# Patient Record
Sex: Male | Born: 1940 | Race: White | Hispanic: No | Smoking: Never smoker
Health system: Southern US, Community
[De-identification: ages and names within clinical notes are randomized; demographics above are authoritative.]

## PROBLEM LIST (undated history)

## (undated) DIAGNOSIS — I4891 Unspecified atrial fibrillation: Secondary | ICD-10-CM

## (undated) DIAGNOSIS — Z9289 Personal history of other medical treatment: Secondary | ICD-10-CM

## (undated) DIAGNOSIS — I739 Peripheral vascular disease, unspecified: Secondary | ICD-10-CM

## (undated) DIAGNOSIS — N529 Male erectile dysfunction, unspecified: Secondary | ICD-10-CM

## (undated) DIAGNOSIS — Z87442 Personal history of urinary calculi: Secondary | ICD-10-CM

## (undated) DIAGNOSIS — M1990 Unspecified osteoarthritis, unspecified site: Secondary | ICD-10-CM

## (undated) DIAGNOSIS — S82209A Unspecified fracture of shaft of unspecified tibia, initial encounter for closed fracture: Secondary | ICD-10-CM

## (undated) DIAGNOSIS — S72353A Displaced comminuted fracture of shaft of unspecified femur, initial encounter for closed fracture: Secondary | ICD-10-CM

## (undated) DIAGNOSIS — I639 Cerebral infarction, unspecified: Secondary | ICD-10-CM

## (undated) DIAGNOSIS — L409 Psoriasis, unspecified: Secondary | ICD-10-CM

## (undated) DIAGNOSIS — C61 Malignant neoplasm of prostate: Secondary | ICD-10-CM

## (undated) DIAGNOSIS — Z86711 Personal history of pulmonary embolism: Secondary | ICD-10-CM

## (undated) DIAGNOSIS — S82409A Unspecified fracture of shaft of unspecified fibula, initial encounter for closed fracture: Secondary | ICD-10-CM

## (undated) DIAGNOSIS — K4090 Unilateral inguinal hernia, without obstruction or gangrene, not specified as recurrent: Secondary | ICD-10-CM

## (undated) DIAGNOSIS — Z8781 Personal history of (healed) traumatic fracture: Secondary | ICD-10-CM

## (undated) DIAGNOSIS — E785 Hyperlipidemia, unspecified: Secondary | ICD-10-CM

## (undated) DIAGNOSIS — K8020 Calculus of gallbladder without cholecystitis without obstruction: Secondary | ICD-10-CM

## (undated) HISTORY — DX: Unspecified osteoarthritis, unspecified site: M19.90

## (undated) HISTORY — DX: Hyperlipidemia, unspecified: E78.5

## (undated) HISTORY — PX: TONSILLECTOMY: SUR1361

## (undated) HISTORY — DX: Unspecified atrial fibrillation: I48.91

## (undated) HISTORY — PX: EXTRACORPOREAL SHOCK WAVE LITHOTRIPSY: SHX1557

## (undated) HISTORY — PX: ORIF FEMORAL SHAFT FRACTURE W/ PLATES AND SCREWS: SUR931

## (undated) HISTORY — DX: Male erectile dysfunction, unspecified: N52.9

## (undated) HISTORY — DX: Malignant neoplasm of prostate: C61

## (undated) HISTORY — DX: Psoriasis, unspecified: L40.9

## (undated) HISTORY — PX: CATARACT EXTRACTION W/ INTRAOCULAR LENS IMPLANT: SHX1309

## (undated) HISTORY — DX: Personal history of (healed) traumatic fracture: Z87.81

## (undated) HISTORY — PX: OTHER SURGICAL HISTORY: SHX169

## (undated) HISTORY — DX: Unspecified fracture of shaft of unspecified tibia, initial encounter for closed fracture: S82.409A

## (undated) HISTORY — DX: Displaced comminuted fracture of shaft of unspecified femur, initial encounter for closed fracture: S72.353A

## (undated) HISTORY — DX: Unspecified fracture of shaft of unspecified tibia, initial encounter for closed fracture: S82.209A

---

## 1989-03-25 DIAGNOSIS — Z86711 Personal history of pulmonary embolism: Secondary | ICD-10-CM

## 1989-03-25 DIAGNOSIS — I639 Cerebral infarction, unspecified: Secondary | ICD-10-CM

## 1989-03-25 HISTORY — DX: Personal history of pulmonary embolism: Z86.711

## 1989-03-25 HISTORY — DX: Cerebral infarction, unspecified: I63.9

## 2004-12-05 ENCOUNTER — Ambulatory Visit: Payer: Self-pay | Admitting: Internal Medicine

## 2004-12-27 ENCOUNTER — Ambulatory Visit: Payer: Self-pay | Admitting: Internal Medicine

## 2006-04-04 ENCOUNTER — Ambulatory Visit: Payer: Self-pay | Admitting: Internal Medicine

## 2006-11-26 ENCOUNTER — Encounter: Payer: Self-pay | Admitting: Internal Medicine

## 2006-11-26 DIAGNOSIS — L408 Other psoriasis: Secondary | ICD-10-CM | POA: Insufficient documentation

## 2006-11-26 DIAGNOSIS — F528 Other sexual dysfunction not due to a substance or known physiological condition: Secondary | ICD-10-CM | POA: Insufficient documentation

## 2006-11-26 DIAGNOSIS — M19019 Primary osteoarthritis, unspecified shoulder: Secondary | ICD-10-CM | POA: Insufficient documentation

## 2006-11-26 DIAGNOSIS — E785 Hyperlipidemia, unspecified: Secondary | ICD-10-CM | POA: Insufficient documentation

## 2006-11-29 DIAGNOSIS — M159 Polyosteoarthritis, unspecified: Secondary | ICD-10-CM | POA: Insufficient documentation

## 2006-11-29 DIAGNOSIS — M1990 Unspecified osteoarthritis, unspecified site: Secondary | ICD-10-CM

## 2006-12-09 ENCOUNTER — Ambulatory Visit: Payer: Self-pay | Admitting: Internal Medicine

## 2006-12-10 LAB — CONVERTED CEMR LAB
Glucose, Bld: 87 mg/dL (ref 70–99)
PSA: 0.02 ng/mL — ABNORMAL LOW (ref 0.10–4.00)

## 2007-08-05 ENCOUNTER — Telehealth (INDEPENDENT_AMBULATORY_CARE_PROVIDER_SITE_OTHER): Payer: Self-pay | Admitting: *Deleted

## 2008-02-04 ENCOUNTER — Telehealth: Payer: Self-pay | Admitting: Internal Medicine

## 2008-02-09 ENCOUNTER — Ambulatory Visit: Payer: Self-pay | Admitting: Family Medicine

## 2008-02-09 DIAGNOSIS — M7120 Synovial cyst of popliteal space [Baker], unspecified knee: Secondary | ICD-10-CM | POA: Insufficient documentation

## 2008-04-14 ENCOUNTER — Ambulatory Visit: Payer: Self-pay | Admitting: Internal Medicine

## 2008-04-14 DIAGNOSIS — C61 Malignant neoplasm of prostate: Secondary | ICD-10-CM

## 2008-04-14 DIAGNOSIS — Z8546 Personal history of malignant neoplasm of prostate: Secondary | ICD-10-CM | POA: Insufficient documentation

## 2008-04-18 LAB — CONVERTED CEMR LAB
ALT: 14 units/L (ref 0–53)
AST: 23 units/L (ref 0–37)
Albumin: 3.6 g/dL (ref 3.5–5.2)
Basophils Relative: 0.5 % (ref 0.0–3.0)
Calcium: 9.1 mg/dL (ref 8.4–10.5)
Eosinophils Relative: 1.7 % (ref 0.0–5.0)
GFR calc Af Amer: 125 mL/min
GFR calc non Af Amer: 103 mL/min
Glucose, Bld: 74 mg/dL (ref 70–99)
HCT: 36.1 % — ABNORMAL LOW (ref 39.0–52.0)
Hemoglobin: 12.5 g/dL — ABNORMAL LOW (ref 13.0–17.0)
Lymphocytes Relative: 30.8 % (ref 12.0–46.0)
Monocytes Absolute: 0.4 10*3/uL (ref 0.1–1.0)
Monocytes Relative: 5.4 % (ref 3.0–12.0)
Neutro Abs: 4.1 10*3/uL (ref 1.4–7.7)
PSA: 0.38 ng/mL (ref 0.10–4.00)
Phosphorus: 3.8 mg/dL (ref 2.3–4.6)
Potassium: 4.1 meq/L (ref 3.5–5.1)
RBC: 3.78 M/uL — ABNORMAL LOW (ref 4.22–5.81)
Sodium: 138 meq/L (ref 135–145)
Total Protein: 6.9 g/dL (ref 6.0–8.3)

## 2008-04-27 ENCOUNTER — Ambulatory Visit: Payer: Self-pay | Admitting: Internal Medicine

## 2008-05-06 ENCOUNTER — Telehealth: Payer: Self-pay | Admitting: Internal Medicine

## 2008-05-11 ENCOUNTER — Ambulatory Visit: Payer: Self-pay | Admitting: Internal Medicine

## 2008-05-11 LAB — HM COLONOSCOPY

## 2009-03-25 HISTORY — PX: HERNIA REPAIR: SHX51

## 2009-05-11 ENCOUNTER — Ambulatory Visit: Payer: Self-pay | Admitting: Internal Medicine

## 2009-05-11 DIAGNOSIS — H539 Unspecified visual disturbance: Secondary | ICD-10-CM | POA: Insufficient documentation

## 2009-05-19 ENCOUNTER — Encounter: Payer: Self-pay | Admitting: Internal Medicine

## 2009-05-25 ENCOUNTER — Encounter: Payer: Self-pay | Admitting: Internal Medicine

## 2009-05-25 ENCOUNTER — Ambulatory Visit: Payer: Self-pay

## 2009-08-28 ENCOUNTER — Ambulatory Visit: Payer: Self-pay | Admitting: Internal Medicine

## 2009-08-28 DIAGNOSIS — R011 Cardiac murmur, unspecified: Secondary | ICD-10-CM | POA: Insufficient documentation

## 2009-08-30 LAB — CONVERTED CEMR LAB
Albumin: 3.8 g/dL (ref 3.5–5.2)
Alkaline Phosphatase: 73 units/L (ref 39–117)
BUN: 19 mg/dL (ref 6–23)
Basophils Absolute: 0.1 10*3/uL (ref 0.0–0.1)
Chloride: 106 meq/L (ref 96–112)
Creatinine, Ser: 0.9 mg/dL (ref 0.4–1.5)
Eosinophils Absolute: 0.1 10*3/uL (ref 0.0–0.7)
Hemoglobin: 12.8 g/dL — ABNORMAL LOW (ref 13.0–17.0)
Lymphocytes Relative: 30.6 % (ref 12.0–46.0)
Lymphs Abs: 1.8 10*3/uL (ref 0.7–4.0)
MCHC: 35 g/dL (ref 30.0–36.0)
Neutro Abs: 3.3 10*3/uL (ref 1.4–7.7)
PSA: 2.5 ng/mL (ref 0.10–4.00)
Phosphorus: 3.8 mg/dL (ref 2.3–4.6)
Platelets: 288 10*3/uL (ref 150.0–400.0)
RDW: 13.8 % (ref 11.5–14.6)
TSH: 2.72 microintl units/mL (ref 0.35–5.50)
Total Bilirubin: 0.6 mg/dL (ref 0.3–1.2)

## 2009-12-04 ENCOUNTER — Ambulatory Visit: Payer: Self-pay | Admitting: Internal Medicine

## 2009-12-04 DIAGNOSIS — K625 Hemorrhage of anus and rectum: Secondary | ICD-10-CM | POA: Insufficient documentation

## 2009-12-29 ENCOUNTER — Ambulatory Visit: Payer: Self-pay | Admitting: Internal Medicine

## 2009-12-29 DIAGNOSIS — K439 Ventral hernia without obstruction or gangrene: Secondary | ICD-10-CM | POA: Insufficient documentation

## 2010-01-09 ENCOUNTER — Encounter: Payer: Self-pay | Admitting: Internal Medicine

## 2010-01-11 ENCOUNTER — Ambulatory Visit: Payer: Self-pay | Admitting: General Surgery

## 2010-01-15 ENCOUNTER — Ambulatory Visit: Payer: Self-pay | Admitting: General Surgery

## 2010-01-23 ENCOUNTER — Encounter: Payer: Self-pay | Admitting: Internal Medicine

## 2010-01-23 ENCOUNTER — Ambulatory Visit: Payer: Self-pay | Admitting: General Surgery

## 2010-01-23 HISTORY — PX: OTHER SURGICAL HISTORY: SHX169

## 2010-02-02 ENCOUNTER — Encounter: Payer: Self-pay | Admitting: Internal Medicine

## 2010-03-02 ENCOUNTER — Encounter: Payer: Self-pay | Admitting: Internal Medicine
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
## 2010-04-25 NOTE — Letter (Signed)
Summary: [facility]  [facility]   Imported By: Maryln Gottron 02/16/2010 10:33:53  _____________________________________________________________________  External Attachment:    Type:   Image     Comment:   External Document  Appended Document:  [facility] doing well after laparoscopic ventral hernia repair

## 2010-04-25 NOTE — Letter (Signed)
Summary: [facility]  [facility]   Imported By: Lanelle Bal 01/22/2010 11:27:57  _____________________________________________________________________  External Attachment:    Type:   Image     Comment:   External Document  Appended Document: [facility] Planning CT then laparoscopic repair of ventral hernias
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
## 2010-04-25 NOTE — Miscellaneous (Signed)
Summary: Orders Update  Clinical Lists Changes  Orders: Added new Test order of Carotid Duplex (Carotid Duplex) - Signed 
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
## 2010-04-26 NOTE — Letter (Signed)
Summary: [facility]  [facility]   Imported By: Lanelle Bal 03/14/2010 09:43:54  _____________________________________________________________________  External Attachment:    Type:   Image     Comment:   External Document  Appended Document: [facility] some strain but healing after hernia repair

## 2010-05-15 ENCOUNTER — Encounter: Payer: Self-pay | Admitting: Internal Medicine

## 2010-05-31 NOTE — Letter (Signed)
Summary: [facility]  [facility]   Imported By: Kassie Mends 05/23/2010 09:03:23  _____________________________________________________________________  External Attachment:    Type:   Image     Comment:   External Document  Appended Document: [facility] recovered from ventral hernia repair

## 2010-08-06 ENCOUNTER — Other Ambulatory Visit: Payer: Self-pay | Admitting: Internal Medicine

## 2010-08-07 NOTE — Telephone Encounter (Signed)
Okay to send Rx for 1 year Have him schedule a physical for the fall

## 2010-08-07 NOTE — Telephone Encounter (Signed)
rx sent to pharmacy

## 2011-08-30 ENCOUNTER — Other Ambulatory Visit: Payer: Self-pay | Admitting: Internal Medicine

## 2011-09-03 ENCOUNTER — Encounter: Payer: Self-pay | Admitting: Internal Medicine

## 2011-09-04 ENCOUNTER — Encounter: Payer: Self-pay | Admitting: Internal Medicine

## 2011-09-04 ENCOUNTER — Ambulatory Visit (INDEPENDENT_AMBULATORY_CARE_PROVIDER_SITE_OTHER): Payer: BC Managed Care – HMO | Admitting: Internal Medicine

## 2011-09-04 VITALS — BP 130/70 | HR 75 | Temp 97.7°F | Ht 70.0 in | Wt 206.0 lb

## 2011-09-04 DIAGNOSIS — IMO0002 Reserved for concepts with insufficient information to code with codable children: Secondary | ICD-10-CM

## 2011-09-04 DIAGNOSIS — K625 Hemorrhage of anus and rectum: Secondary | ICD-10-CM

## 2011-09-04 DIAGNOSIS — M1710 Unilateral primary osteoarthritis, unspecified knee: Secondary | ICD-10-CM

## 2011-09-04 MED ORDER — TADALAFIL 5 MG PO TABS: 5.0000 mg | ORAL_TABLET | Freq: Every day | ORAL | Status: DC

## 2011-09-04 MED ORDER — HYDROCORTISONE ACETATE 25 MG RE SUPP: 25.0000 mg | Freq: Two times a day (BID) | RECTAL | Status: DC | PRN

## 2011-09-04 NOTE — Patient Instructions (Signed)
Please try the suppositories and use Tuck's pads (witch hazel) for wiping  Trying acetaminophen arthritis formula 650mg  three times daily Use ibuprofen 400mg  up to three times daily as well We will reevaluate your joint pain at your follow up appt

## 2011-09-04 NOTE — Assessment & Plan Note (Signed)
Will try anusol suppositories Has post op changes that cause some pocketing Try Tucks wipes also
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
## 2011-11-21 ENCOUNTER — Ambulatory Visit (INDEPENDENT_AMBULATORY_CARE_PROVIDER_SITE_OTHER): Payer: BC Managed Care – HMO | Admitting: Internal Medicine

## 2011-11-21 ENCOUNTER — Encounter: Payer: Self-pay | Admitting: Internal Medicine

## 2011-11-21 VITALS — BP 140/80 | HR 72 | Temp 97.9°F | Ht 70.0 in | Wt 203.0 lb

## 2011-11-21 DIAGNOSIS — IMO0002 Reserved for concepts with insufficient information to code with codable children: Secondary | ICD-10-CM

## 2011-11-21 DIAGNOSIS — C61 Malignant neoplasm of prostate: Secondary | ICD-10-CM

## 2011-11-21 DIAGNOSIS — Z0000 Encounter for general adult medical examination without abnormal findings: Secondary | ICD-10-CM

## 2011-11-21 DIAGNOSIS — M1710 Unilateral primary osteoarthritis, unspecified knee: Secondary | ICD-10-CM

## 2011-11-21 DIAGNOSIS — E785 Hyperlipidemia, unspecified: Secondary | ICD-10-CM
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
## 2011-11-21 NOTE — Assessment & Plan Note (Signed)
Not really interested in Rx Will recheck

## 2011-11-21 NOTE — Patient Instructions (Signed)
Please set up appointment with Dr Patsy Lager to evaluate chronic knee and hip pain

## 2011-11-21 NOTE — Assessment & Plan Note (Signed)
On his meds from [place] Will check PSA

## 2011-11-21 NOTE — Assessment & Plan Note (Signed)
Ongoing issues Will refer to Dr Patsy Lager to start

## 2011-11-21 NOTE — Assessment & Plan Note (Signed)
Fairly healthy Rx given for zostavax UTD on colon

## 2011-11-22 LAB — HEPATIC FUNCTION PANEL
ALT: 34 U/L (ref 0–53)
AST: 34 U/L (ref 0–37)
Albumin: 4.1 g/dL (ref 3.5–5.2)
Total Bilirubin: 0.6 mg/dL (ref 0.3–1.2)

## 2011-11-22 LAB — LIPID PANEL
Cholesterol: 244 mg/dL — ABNORMAL HIGH (ref 0–200)
HDL: 49.8 mg/dL
Total CHOL/HDL Ratio: 5
Triglycerides: 224 mg/dL — ABNORMAL HIGH (ref 0.0–149.0)
VLDL: 44.8 mg/dL — ABNORMAL HIGH (ref 0.0–40.0)

## 2011-11-22 LAB — BASIC METABOLIC PANEL
BUN: 25 mg/dL — ABNORMAL HIGH (ref 6–23)
CO2: 26 mEq/L (ref 19–32)
Chloride: 106 mEq/L (ref 96–112)
Creatinine, Ser: 1.1 mg/dL (ref 0.4–1.5)
Glucose, Bld: 87 mg/dL (ref 70–99)
Potassium: 4.1 mEq/L (ref 3.5–5.1)

## 2011-11-22 LAB — CBC WITH DIFFERENTIAL/PLATELET
Eosinophils Absolute: 0.1 10*3/uL (ref 0.0–0.7)
Eosinophils Relative: 1.7 % (ref 0.0–5.0)
HCT: 43.5 % (ref 39.0–52.0)
Lymphs Abs: 2.1 10*3/uL (ref 0.7–4.0)
MCHC: 33.3 g/dL (ref 30.0–36.0)
MCV: 100.8 fl — ABNORMAL HIGH (ref 78.0–100.0)
Monocytes Absolute: 0.6 10*3/uL (ref 0.1–1.0)
Neutrophils Relative %: 57.8 % (ref 43.0–77.0)
Platelets: 252 10*3/uL (ref 150.0–400.0)

## 2011-11-22 LAB — PSA: PSA: 5.19 ng/mL — ABNORMAL HIGH (ref 0.10–4.00)

## 2011-11-22 LAB — LDL CHOLESTEROL, DIRECT: Direct LDL: 173.9 mg/dL

## 2011-11-22 LAB — TSH: TSH: 2.17 u[IU]/mL (ref 0.35–5.50)

## 2011-11-28 ENCOUNTER — Ambulatory Visit (INDEPENDENT_AMBULATORY_CARE_PROVIDER_SITE_OTHER): Payer: BC Managed Care – HMO | Admitting: Family Medicine

## 2011-11-28 ENCOUNTER — Ambulatory Visit (INDEPENDENT_AMBULATORY_CARE_PROVIDER_SITE_OTHER)
Admission: RE | Admit: 2011-11-28 | Discharge: 2011-11-28 | Disposition: A | Discharge status: Home / Self Care | Payer: BC Managed Care – HMO | Source: Ambulatory Visit | Attending: Family Medicine | Admitting: Family Medicine

## 2011-11-28 ENCOUNTER — Encounter: Payer: Self-pay | Admitting: Family Medicine

## 2011-11-28 VITALS — BP 130/80 | HR 60 | Resp 16 | Wt 204.8 lb

## 2011-11-28 DIAGNOSIS — M25569 Pain in unspecified knee: Secondary | ICD-10-CM

## 2011-11-28 DIAGNOSIS — M170 Bilateral primary osteoarthritis of knee: Secondary | ICD-10-CM

## 2011-11-28 DIAGNOSIS — M25559 Pain in unspecified hip: Secondary | ICD-10-CM

## 2011-11-28 DIAGNOSIS — M1610 Unilateral primary osteoarthritis, unspecified hip: Secondary | ICD-10-CM

## 2011-11-28 DIAGNOSIS — M2170 Unequal limb length (acquired), unspecified site: Secondary | ICD-10-CM

## 2011-11-28 DIAGNOSIS — M1710 Unilateral primary osteoarthritis, unspecified knee: Secondary | ICD-10-CM

## 2011-11-28 MED ORDER — TRAMADOL HCL 50 MG PO TABS: 50.0000 mg | ORAL_TABLET | Freq: Four times a day (QID) | ORAL | Status: DC | PRN

## 2011-11-28 MED ORDER — DICLOFENAC SODIUM 75 MG PO TBEC: 75.0000 mg | DELAYED_RELEASE_TABLET | Freq: Two times a day (BID) | ORAL | Status: DC
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
## 2011-11-29 ENCOUNTER — Encounter: Payer: Self-pay | Admitting: *Deleted

## 2011-11-29 ENCOUNTER — Encounter: Payer: Self-pay | Admitting: Family Medicine

## 2011-11-29 DIAGNOSIS — M1610 Unilateral primary osteoarthritis, unspecified hip: Secondary | ICD-10-CM | POA: Insufficient documentation

## 2011-11-29 DIAGNOSIS — M170 Bilateral primary osteoarthritis of knee: Secondary | ICD-10-CM | POA: Insufficient documentation

## 2011-12-11 ENCOUNTER — Other Ambulatory Visit: Payer: Self-pay | Admitting: Internal Medicine

## 2012-02-17 ENCOUNTER — Other Ambulatory Visit: Payer: Self-pay | Admitting: Internal Medicine

## 2012-04-24 ENCOUNTER — Other Ambulatory Visit: Payer: Self-pay | Admitting: Family Medicine

## 2012-04-24 ENCOUNTER — Other Ambulatory Visit: Payer: Self-pay | Admitting: Internal Medicine

## 2012-04-24 NOTE — Telephone Encounter (Signed)
rx sent to pharmacy by e-script  

## 2012-04-24 NOTE — Telephone Encounter (Signed)
Okay #60 x 0 

## 2012-07-08 ENCOUNTER — Other Ambulatory Visit: Payer: Self-pay | Admitting: Internal Medicine

## 2012-07-08 NOTE — Telephone Encounter (Signed)
rx sent to pharmacy by e-script  

## 2012-07-08 NOTE — Telephone Encounter (Signed)
Okay #60 x 0 

## 2012-09-14 ENCOUNTER — Other Ambulatory Visit: Payer: Self-pay | Admitting: Internal Medicine

## 2012-09-14 ENCOUNTER — Other Ambulatory Visit: Payer: Self-pay | Admitting: Family Medicine

## 2012-09-15 NOTE — Telephone Encounter (Signed)
Okay to fill for a year 

## 2012-09-15 NOTE — Telephone Encounter (Signed)
rx sent to pharmacy by e-script  

## 2012-09-15 NOTE — Telephone Encounter (Signed)
Okay #60 x 0 

## 2012-10-12 ENCOUNTER — Other Ambulatory Visit: Payer: Self-pay | Admitting: Internal Medicine

## 2012-10-13 NOTE — Telephone Encounter (Signed)
rx sent to pharmacy by e-script  

## 2012-10-13 NOTE — Telephone Encounter (Signed)
Okay to refill for a year 

## 2012-11-19 ENCOUNTER — Ambulatory Visit (INDEPENDENT_AMBULATORY_CARE_PROVIDER_SITE_OTHER): Payer: Medicare Other | Admitting: Internal Medicine

## 2012-11-19 ENCOUNTER — Encounter: Payer: Self-pay | Admitting: Internal Medicine

## 2012-11-19 VITALS — BP 150/80 | HR 69 | Temp 98.0°F | Ht 70.0 in | Wt 192.0 lb

## 2012-11-19 DIAGNOSIS — M1710 Unilateral primary osteoarthritis, unspecified knee: Secondary | ICD-10-CM

## 2012-11-19 DIAGNOSIS — Z23 Encounter for immunization: Secondary | ICD-10-CM

## 2012-11-19 DIAGNOSIS — C61 Malignant neoplasm of prostate: Secondary | ICD-10-CM

## 2012-11-19 DIAGNOSIS — M170 Bilateral primary osteoarthritis of knee: Secondary | ICD-10-CM

## 2012-11-19 DIAGNOSIS — E785 Hyperlipidemia, unspecified: Secondary | ICD-10-CM

## 2012-11-19 DIAGNOSIS — Z0000 Encounter for general adult medical examination without abnormal findings: Secondary | ICD-10-CM

## 2012-11-19 MED ORDER — ZOSTER VACCINE LIVE 19400 UNT/0.65ML ~~LOC~~ SOLR: 0.6500 mL | Freq: Once | SUBCUTANEOUS | Status: DC

## 2012-11-19 NOTE — Assessment & Plan Note (Signed)
Generally healthy Rx for zostavax UTD otherwise

## 2012-11-19 NOTE — Assessment & Plan Note (Signed)
Doing well with tramadol and occ ibuprofen

## 2012-11-19 NOTE — Addendum Note (Signed)
Addended by: Sueanne Margarita on: 11/19/2012 05:28 PM   Modules accepted: Orders
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
## 2012-11-19 NOTE — Assessment & Plan Note (Signed)
Discussed primary prevention with statin---he doesn't want this

## 2012-11-19 NOTE — Assessment & Plan Note (Signed)
Back on his meds If not up much, no change If up some, he will increase dose If very high, we will discuss

## 2012-11-20 ENCOUNTER — Other Ambulatory Visit: Payer: Self-pay | Admitting: *Deleted

## 2012-11-20 LAB — HEPATIC FUNCTION PANEL
ALT: 19 U/L (ref 0–53)
AST: 20 U/L (ref 0–37)
Albumin: 3.8 g/dL (ref 3.5–5.2)
Alkaline Phosphatase: 60 U/L (ref 39–117)
Bilirubin, Direct: 0.1 mg/dL (ref 0.0–0.3)
Total Bilirubin: 0.5 mg/dL (ref 0.3–1.2)
Total Protein: 6.9 g/dL (ref 6.0–8.3)

## 2012-11-20 LAB — TSH: TSH: 2.99 u[IU]/mL (ref 0.35–5.50)

## 2012-11-20 LAB — CBC WITH DIFFERENTIAL/PLATELET
Basophils Absolute: 0 K/uL (ref 0.0–0.1)
Basophils Relative: 0.6 % (ref 0.0–3.0)
Eosinophils Absolute: 0.1 K/uL (ref 0.0–0.7)
Eosinophils Relative: 1.5 % (ref 0.0–5.0)
HCT: 38.1 % — ABNORMAL LOW (ref 39.0–52.0)
Hemoglobin: 13.3 g/dL (ref 13.0–17.0)
Lymphocytes Relative: 28.7 % (ref 12.0–46.0)
Lymphs Abs: 2 K/uL (ref 0.7–4.0)
MCHC: 34.9 g/dL (ref 30.0–36.0)
MCV: 98.1 fl (ref 78.0–100.0)
Monocytes Absolute: 0.5 K/uL (ref 0.1–1.0)
Monocytes Relative: 6.5 % (ref 3.0–12.0)
Neutro Abs: 4.3 K/uL (ref 1.4–7.7)
Neutrophils Relative %: 62.7 % (ref 43.0–77.0)
Platelets: 261 K/uL (ref 150.0–400.0)
RBC: 3.88 Mil/uL — ABNORMAL LOW (ref 4.22–5.81)
RDW: 13.2 % (ref 11.5–14.6)
WBC: 6.9 K/uL (ref 4.5–10.5)

## 2012-11-20 LAB — LDL CHOLESTEROL, DIRECT: Direct LDL: 186.1 mg/dL

## 2012-11-20 LAB — LIPID PANEL
Cholesterol: 272 mg/dL — ABNORMAL HIGH (ref 0–200)
HDL: 62 mg/dL (ref >39.00)
Total CHOL/HDL Ratio: 4
Triglycerides: 191 mg/dL — ABNORMAL HIGH (ref 0.0–149.0)
VLDL: 38.2 mg/dL (ref 0.0–40.0)

## 2012-11-20 LAB — BASIC METABOLIC PANEL
BUN: 21 mg/dL (ref 6–23)
CO2: 28 mEq/L (ref 19–32)
Calcium: 9.2 mg/dL (ref 8.4–10.5)
Glucose, Bld: 99 mg/dL (ref 70–99)
Potassium: 3.8 mEq/L (ref 3.5–5.1)
Sodium: 137 mEq/L (ref 135–145)

## 2012-11-20 MED ORDER — TRAMADOL HCL 50 MG PO TABS: ORAL_TABLET | ORAL | Status: DC

## 2012-11-20 NOTE — Telephone Encounter (Signed)
Last filled 09/15/12 

## 2012-11-20 NOTE — Telephone Encounter (Signed)
Phoned in to pharmacy. 

## 2012-11-20 NOTE — Telephone Encounter (Signed)
Okay #60 x 0 

## 2012-12-01 ENCOUNTER — Other Ambulatory Visit: Payer: Self-pay | Admitting: *Deleted

## 2012-12-02 NOTE — Telephone Encounter (Signed)
Just ordered 8/29 ----see what happened

## 2012-12-02 NOTE — Telephone Encounter (Signed)
duplicate

## 2013-10-25 ENCOUNTER — Other Ambulatory Visit: Payer: Self-pay | Admitting: Internal Medicine

## 2013-10-27 ENCOUNTER — Other Ambulatory Visit: Payer: Self-pay | Admitting: Internal Medicine

## 2013-11-05 ENCOUNTER — Encounter: Payer: Self-pay | Admitting: Internal Medicine

## 2013-12-06 ENCOUNTER — Other Ambulatory Visit: Payer: Self-pay | Admitting: Internal Medicine

## 2013-12-16 ENCOUNTER — Encounter: Payer: Self-pay | Admitting: Internal Medicine

## 2013-12-16 ENCOUNTER — Ambulatory Visit (INDEPENDENT_AMBULATORY_CARE_PROVIDER_SITE_OTHER): Payer: Medicare Other | Admitting: Internal Medicine

## 2013-12-16 VITALS — BP 127/60 | HR 67 | Temp 98.0°F | Wt 199.0 lb

## 2013-12-16 DIAGNOSIS — IMO0002 Reserved for concepts with insufficient information to code with codable children: Secondary | ICD-10-CM

## 2013-12-16 DIAGNOSIS — M2170 Unequal limb length (acquired), unspecified site: Secondary | ICD-10-CM

## 2013-12-16 DIAGNOSIS — Z23 Encounter for immunization: Secondary | ICD-10-CM

## 2013-12-16 DIAGNOSIS — M1710 Unilateral primary osteoarthritis, unspecified knee: Secondary | ICD-10-CM

## 2013-12-16 DIAGNOSIS — M170 Bilateral primary osteoarthritis of knee: Secondary | ICD-10-CM

## 2013-12-16 DIAGNOSIS — F528 Other sexual dysfunction not due to a substance or known physiological condition: Secondary | ICD-10-CM

## 2013-12-16 DIAGNOSIS — C61 Malignant neoplasm of prostate: Secondary | ICD-10-CM

## 2013-12-16 MED ORDER — SILDENAFIL CITRATE 20 MG PO TABS: 60.0000 mg | ORAL_TABLET | Freq: Every day | ORAL | Status: DC | PRN

## 2013-12-16 NOTE — Progress Notes (Signed)
Pre visit review using our clinic review tool, if applicable. No additional management support is needed unless otherwise documented below in the visit note. 

## 2013-12-16 NOTE — Assessment & Plan Note (Signed)
Quite significant He uses tiny insert in shoe--this is clearly inadequate (has clear gait problems) Consult with Dr Lorelei Pont
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
## 2013-12-16 NOTE — Assessment & Plan Note (Signed)
Hopefully still controlled on his product from [place]

## 2013-12-16 NOTE — Assessment & Plan Note (Signed)
Will change to just episodic use of sildenafil

## 2013-12-16 NOTE — Patient Instructions (Signed)
Set up an appointment with Dr Lorelei Pont

## 2013-12-16 NOTE — Assessment & Plan Note (Signed)
Left is worse than right Wants further evaluation --to consider surgery I would like to start with Dr Lorelei Pont due to other factors that may be amenable (heel lift)

## 2013-12-16 NOTE — Addendum Note (Signed)
Addended by: Despina Hidden on: 12/16/2013 05:20 PM   Modules accepted: Orders

## 2013-12-17 LAB — PSA: PSA: 2.23 ng/mL (ref 0.10–4.00)

## 2013-12-23 ENCOUNTER — Ambulatory Visit (INDEPENDENT_AMBULATORY_CARE_PROVIDER_SITE_OTHER): Payer: Medicare Other | Admitting: Family Medicine

## 2013-12-23 ENCOUNTER — Encounter: Payer: Self-pay | Admitting: Family Medicine

## 2013-12-23 VITALS — BP 150/84 | HR 75 | Temp 97.7°F | Ht 70.0 in | Wt 199.0 lb

## 2013-12-23 DIAGNOSIS — M1990 Unspecified osteoarthritis, unspecified site: Secondary | ICD-10-CM

## 2013-12-23 DIAGNOSIS — M2170 Unequal limb length (acquired), unspecified site: Secondary | ICD-10-CM

## 2013-12-23 DIAGNOSIS — M1712 Unilateral primary osteoarthritis, left knee: Secondary | ICD-10-CM

## 2013-12-23 DIAGNOSIS — M5489 Other dorsalgia: Secondary | ICD-10-CM

## 2013-12-23 DIAGNOSIS — M1610 Unilateral primary osteoarthritis, unspecified hip: Secondary | ICD-10-CM

## 2013-12-23 NOTE — Progress Notes (Signed)
Dr. Frederico Hamman T. Denise Bramblett, MD, [facility] [place], [address] Phone: [phone] Fax: [phone]  12/23/2013  Patient: Isaac Powers, MRN: [id number], DOB: [birth year], 73 y.o.  Primary Physician:  Viviana Simpler, MD  Chief Complaint: Knee Pain  Subjective:   Isaac Powers is a 73 y.o. very pleasant male patient who presents with the following:  73 yo with complex orthopedic history with distant R pelvic fracture, hip fracture, removal of hardware, L LE complex fracture with extended cast, non-union, ORIF, subsequent infection and extensive operation on the left LE as well - presents with chronic B knee OA pains and questions regarding LL inequality.   Up and down, and one day last week had a hard day standing and  Other days. Still walking the golf course. Limps now with walking. The first twenty stairs he can do, then will hurt. He is having pain daily at this point.   Uses some deep cream, which seems to help.   90.5 cm on R 93 cm on the L 2.5 cm LL discrepancy on my measurements.   Past Medical History, Surgical History, Social History, Family History, Problem List, Medications, and Allergies have been reviewed and updated if relevant.   GEN: No acute illnesses, no fevers, chills. GI: No n/v/d, eating normally Pulm: No SOB Interactive and getting along well at home.  Otherwise, ROS is as per the HPI.  Objective:   BP 150/84  Pulse 75  Temp(Src) 97.7 F (36.5 C) (Oral)  Ht 5\' 10"  (1.778 m)  Wt 199 lb (90.266 kg)  BMI 28.55 kg/m2  GEN: WDWN, NAD, Non-toxic, A & O x 3 HEENT: Atraumatic, Normocephalic. Neck supple. No masses, No LAD. Ears and Nose: No external deformity. EXTR: No c/c/e PSYCH: Normally interactive. Conversant. Not depressed or anxious appearing.  Calm demeanor.   LEFT leg: The patient lacks 5 of extension. Flexion is  To 105. Medial collateral ligament and la Anterior cruciate ligament is  stable. ACL and PCL stable. Mcmurrays and flexion pinch cause pain. Medial > Lateral joint line.  R leg lacks 5 deg ext. Flexion to 112 degrees. MCL, LCL, pcl, ACL are all stable. Moderate joint line pain.  LL measurement as above.  Notable pelvic movement with walking.   Laboratory and Imaging Data:  BILATERAL KNEES STANDING - 1 VIEW  Comparison: None  Findings: Moderate to severe osteoarthritis is noted involving both  knees. Changes include marked medial compartment narrowing,  marginal spur formation and sharpening of the tibial spines. There  is also subchondral sclerosis. Left knee appears more severely  affected than the right.  IMPRESSION:  1. Moderate to severe tricompartment osteoarthritis.  Original Report Authenticated By: Angelita Ingles, M.D.  RIGHT KNEE - 1-2 VIEW  Comparison: None  Findings: There is no joint effusion noted.  There is medial compartment narrowing, marginal spur formation and  sharpening tibial spines consistent with moderate to advanced  osteoarthritis.  No fracture or subluxation identified.  IMPRESSION:  1. Moderate to advanced osteoarthritis.  Original Report Authenticated By: Angelita Ingles, M.D.  [place] 2+ VIEW  Comparison: None  Findings: The right hip appears located. No evidence for acute  fracture. There is joint space narrowing, subchondral sclerosis  and marginal spur formation consistent with osteoarthritis T.  There appears to be heterotopic bone formation surrounding the  proximal diaphysis of the right femur. This may be related to  prior trauma but is only  partially visualized and difficult to  characterize. There is soft tissue calcification identified in the  projection of the intertrochanteric region of the right femur.  IMPRESSION:  1. Osteoarthritis.  2. Suspect chronic deformity involving the proximal right femur.  This area is incompletely visualized. Consider further evaluation  with complete  imaging of the right femur.  Original Report Authenticated By: Angelita Ingles, M.D.  11/28/2011 Weightbearing knee films reviewed and hip films.  The radiological images were independently reviewed by myself in the office and results were reviewed with the patient. My independent interpretation of images:  Today, L knee is primarily in question. L hip his only mild OA. L knee on AP WB view shows end-stage DJD, tricompartmental. Electronically Signed  By: Owens Loffler, MD On: 12/26/2013 9:24 AM    Assessment and Plan:   Primary osteoarthritis of left knee - Plan: Ambulatory referral to Orthopedic Surgery  Leg length difference, acquired  Hip arthritis  Back pain without sciatica  He has a 2.5 cm leg length discrepancy. I made an 8 mm insole correction to try, but it is not possible to correct this much LL discrepancy in an insole correction. I wrote for a 2 cm LL outsole correction on a script for [employer]. If he likes it, we can have more shoes altered.   Globally, we talked about his knee, mostly LEFT. He is ready to talk about TKA now. I am going to set him up with Dr. Ricki Rodriguez or Alvan Dame to discuss further.   Follow-up: No Follow-up on file.  New Prescriptions   No medications on file   Orders Placed This Encounter  Procedures  . Ambulatory referral to Orthopedic Surgery    Signed,  Frederico Hamman T. Bevelyn Arriola, MD   Patient's Medications  New Prescriptions   No medications on file  Previous Medications   FISH OIL-OMEGA-3 FATTY ACIDS 1000 MG CAPSULE    Take 2 g by mouth daily.   GLUCOSAMINE SULFATE 750 MG CAPS    Take by mouth as needed.     IBUPROFEN (ADVIL,MOTRIN) 400 MG TABLET    Take 400 mg by mouth daily as needed for pain.   MULTIPLE VITAMINS-MINERALS (CENTRUM SILVER ULTRA MENS) TABS    Take by mouth daily.   SILDENAFIL (REVATIO) 20 MG TABLET    Take 3-5 tablets (60-100 mg total) by mouth daily as needed.   SPECIALTY VITAMINS PRODUCTS (PROSTATE) TABS    Take by mouth. From  [place]    VITAMIN B-12 (CYANOCOBALAMIN) 1000 MCG TABLET    Take 1,000 mcg by mouth daily.  Modified Medications   No medications on file  Discontinued Medications   No medications on file

## 2013-12-23 NOTE — Patient Instructions (Signed)

## 2013-12-23 NOTE — Progress Notes (Signed)
Pre visit review using our clinic review tool, if applicable. No additional management support is needed unless otherwise documented below in the visit note. 

## 2014-01-07 ENCOUNTER — Other Ambulatory Visit: Payer: Self-pay

## 2014-05-03 ENCOUNTER — Ambulatory Visit: Payer: Self-pay | Admitting: Orthopedic Surgery

## 2014-05-03 NOTE — Progress Notes (Signed)
Preoperative surgical orders have been place into the Epic hospital system for Isaac Powers on 05/03/2014, 11:50 AM  by Mickel Crow for surgery on 06-03-2014.  Preop Total Knee orders including Experal, IV Tylenol, and IV Decadron as long as there are no contraindications to the above medications. Arlee Muslim, PA-C

## 2014-05-12 ENCOUNTER — Ambulatory Visit (INDEPENDENT_AMBULATORY_CARE_PROVIDER_SITE_OTHER): Payer: Medicare Other | Admitting: Internal Medicine

## 2014-05-12 ENCOUNTER — Encounter: Payer: Self-pay | Admitting: Internal Medicine

## 2014-05-12 DIAGNOSIS — Z01818 Encounter for other preprocedural examination: Secondary | ICD-10-CM

## 2014-05-12 NOTE — Progress Notes (Signed)
Pre visit review using our clinic review tool, if applicable. No additional management support is needed unless otherwise documented below in the visit note. 
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
## 2014-05-12 NOTE — Assessment & Plan Note (Addendum)
No chronic cardiac or respiratory disease Generally healthy History and exam are reassuring EKG shows sinus with nonspecific intraventricular conduction delay. Nothing to suggest ischemia  He is cleared medically to proceed with surgery I would recommend standard DVT prophylaxis, etc

## 2014-05-13 ENCOUNTER — Ambulatory Visit: Payer: Self-pay | Admitting: Orthopedic Surgery
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
## 2014-05-24 NOTE — Patient Instructions (Signed)
Your procedure is scheduled on:  06/03/14  FRIDAY  Report to [facility] at [facility]      AM.   Call this number if you have problems the morning of surgery: [phone]        Do not eat food  Or drink :After Midnight. Thursday NIGHT   Take these medicines the morning of surgery with A SIP OF WATER:NONE   .  Contacts, dentures or partial plates, or metal hairpins  can not be worn to surgery. Your family will be responsible for glasses, dentures, hearing aides while you are in surgery  Leave suitcase in the car. After surgery it may be brought to your room.  For patients admitted to the hospital, checkout time is 11:00 AM day of  discharge.         [facility] IS NOT RESPONSIBLE FOR ANY VALUABLES  Patients discharged the day of surgery will not be allowed to drive home. IF going home the day of surgery, you must have a driver and someone to stay with you for the first 24 hours                                                                 [facility] - Preparing for Surgery Before surgery, you can play an important role.  Because skin is not sterile, your skin needs to be as free of germs as possible.  You can reduce the number of germs on your skin by washing with CHG (chlorahexidine gluconate) soap before surgery.  CHG is an antiseptic cleaner which kills germs and bonds with the skin to continue killing germs even after washing. Please DO NOT use if you have an allergy to CHG or antibacterial soaps.  If your skin becomes reddened/irritated stop using the CHG and inform your nurse when you arrive at Short Stay. Do not shave (including legs and underarms) for at least 48 hours prior to the first CHG shower.  You may shave your face/neck. Please follow these instructions carefully:  1.  Shower with CHG Soap the night before surgery and the  morning of Surgery.  2.  If you choose to wash your hair, wash your hair  first as usual with your  normal  shampoo.  3.  After you shampoo, rinse your hair and body thoroughly to remove the  shampoo.                           4.  Use CHG as you would any other liquid soap.  You can apply chg directly  to the skin and wash                       Gently with a scrungie or clean washcloth.  5.  Apply the CHG Soap to your body ONLY FROM THE NECK DOWN.   Do not use on face/ open                           Wound or open sores. Avoid contact with eyes, ears mouth and genitals (private parts).  Wash face,  Genitals (private parts) with your normal soap.             6.  Wash thoroughly, paying special attention to the area where your surgery  will be performed.  7.  Thoroughly rinse your body with warm water from the neck down.  8.  DO NOT shower/wash with your normal soap after using and rinsing off  the CHG Soap.                9.  Pat yourself dry with a clean towel.            10.  Wear clean pajamas.            11.  Place clean sheets on your bed the night of your first shower and do not  sleep with pets. Day of Surgery : Do not apply any lotions/deodorants the morning of surgery.  Please wear clean clothes to the hospital/surgery center.  FAILURE TO FOLLOW THESE INSTRUCTIONS MAY RESULT IN THE CANCELLATION OF YOUR SURGERY PATIENT SIGNATURE_________________________________  NURSE SIGNATURE__________________________________  ________________________________________________________________________   Isaac Powers  An incentive spirometer is a tool that can help keep your lungs clear and active. This tool measures how well you are filling your lungs with each breath. Taking long deep breaths may help reverse or decrease the chance of developing breathing (pulmonary) problems (especially infection) following:  A long period of time when you are unable to move or be active. BEFORE THE PROCEDURE   If the spirometer includes an indicator to  show your best effort, your nurse or respiratory therapist will set it to a desired goal.  If possible, sit up straight or lean slightly forward. Try not to slouch.  Hold the incentive spirometer in an upright position. INSTRUCTIONS FOR USE   Sit on the edge of your bed if possible, or sit up as far as you can in bed or on a chair.  Hold the incentive spirometer in an upright position.  Breathe out normally.  Place the mouthpiece in your mouth and seal your lips tightly around it.  Breathe in slowly and as deeply as possible, raising the piston or the ball toward the top of the column.  Hold your breath for 3-5 seconds or for as long as possible. Allow the piston or ball to fall to the bottom of the column.  Remove the mouthpiece from your mouth and breathe out normally.  Rest for a few seconds and repeat Steps 1 through 7 at least 10 times every 1-2 hours when you are awake. Take your time and take a few normal breaths between deep breaths.  The spirometer may include an indicator to show your best effort. Use the indicator as a goal to work toward during each repetition.  After each set of 10 deep breaths, practice coughing to be sure your lungs are clear. If you have an incision (the cut made at the time of surgery), support your incision when coughing by placing a pillow or rolled up towels firmly against it. Once you are able to get out of bed, walk around indoors and cough well. You may stop using the incentive spirometer when instructed by your caregiver.  RISKS AND COMPLICATIONS  Take your time so you do not get dizzy or light-headed.  If you are in pain, you may need to take or ask for pain medication before doing incentive spirometry. It is harder to take a deep breath if you are having pain.  AFTER USE  Rest and breathe slowly and easily.  It can be helpful to keep track of a log of your progress. Your caregiver can provide you with a simple table to help with this. If  you are using the spirometer at home, follow these instructions: [place] IF:   You are having difficultly using the spirometer.  You have trouble using the spirometer as often as instructed.  Your pain medication is not giving enough relief while using the spirometer.  You develop fever of 100.5 F (38.1 C) or higher. SEEK IMMEDIATE MEDICAL CARE IF:   You cough up bloody sputum that had not been present before.  You develop fever of 102 F (38.9 C) or greater.  You develop worsening pain at or near the incision site. MAKE SURE YOU:   Understand these instructions.  Will watch your condition.  Will get help right away if you are not doing well or get worse. Document Released: 07/22/2006 Document Revised: 06/03/2011 Document Reviewed: 09/22/2006 [employer] Patient Information 2014 [employer], [place].   ________________________________________________________________________  WHAT IS A BLOOD TRANSFUSION? Blood Transfusion Information  A transfusion is the replacement of blood or some of its parts. Blood is made up of multiple cells which provide different functions.  Red blood cells carry oxygen and are used for blood loss replacement.  White blood cells fight against infection.  Platelets control bleeding.  Plasma helps clot blood.  Other blood products are available for specialized needs, such as hemophilia or other clotting disorders. BEFORE THE TRANSFUSION  Who gives blood for transfusions?   Healthy volunteers who are fully evaluated to make sure their blood is safe. This is blood bank blood. Transfusion therapy is the safest it has ever been in the practice of medicine. Before blood is taken from a donor, a complete history is taken to make sure that person has no history of diseases nor engages in risky social behavior (examples are intravenous drug use or sexual activity with multiple partners). The donor's travel history is screened to minimize risk of  transmitting infections, such as malaria. The donated blood is tested for signs of infectious diseases, such as HIV and hepatitis. The blood is then tested to be sure it is compatible with you in order to minimize the chance of a transfusion reaction. If you or a relative donates blood, this is often done in anticipation of surgery and is not appropriate for emergency situations. It takes many days to process the donated blood. RISKS AND COMPLICATIONS Although transfusion therapy is very safe and saves many lives, the main dangers of transfusion include:   Getting an infectious disease.  Developing a transfusion reaction. This is an allergic reaction to something in the blood you were given. Every precaution is taken to prevent this. The decision to have a blood transfusion has been considered carefully by your caregiver before blood is given. Blood is not given unless the benefits outweigh the risks. AFTER THE TRANSFUSION  Right after receiving a blood transfusion, you will usually feel much better and more energetic. This is especially true if your red blood cells have gotten low (anemic). The transfusion raises the level of the red blood cells which carry oxygen, and this usually causes an energy increase.  The nurse administering the transfusion will monitor you carefully for complications. HOME CARE INSTRUCTIONS  No special instructions are needed after a transfusion. You may find your energy is better. Speak with your caregiver about any limitations on activity for underlying diseases  you may have. SEEK MEDICAL CARE IF:   Your condition is not improving after your transfusion.  You develop redness or irritation at the intravenous (IV) site. SEEK IMMEDIATE MEDICAL CARE IF:  Any of the following symptoms occur over the next 12 hours:  Shaking chills.  You have a temperature by mouth above 102 F (38.9 C), not controlled by medicine.  Chest, back, or muscle pain.  People around you  feel you are not acting correctly or are confused.  Shortness of breath or difficulty breathing.  Dizziness and fainting.  You get a rash or develop hives.  You have a decrease in urine output.  Your urine turns a dark color or changes to pink, red, or brown. Any of the following symptoms occur over the next 10 days:  You have a temperature by mouth above 102 F (38.9 C), not controlled by medicine.  Shortness of breath.  Weakness after normal activity.  The white part of the eye turns yellow (jaundice).  You have a decrease in the amount of urine or are urinating less often.  Your urine turns a dark color or changes to pink, red, or brown. Document Released: 03/08/2000 Document Revised: 06/03/2011 Document Reviewed: 10/26/2007 [facility] Patient Information 2014 [place], [place].  _______________________________________________________________________

## 2014-05-24 NOTE — Progress Notes (Signed)
ekg 2/16, lov with clearance 2/16 chart dr Silvio Pate

## 2014-05-27 ENCOUNTER — Encounter (HOSPITAL_COMMUNITY): Payer: Self-pay

## 2014-05-27 ENCOUNTER — Encounter (HOSPITAL_COMMUNITY)
Admission: RE | Admit: 2014-05-27 | Discharge: 2014-05-27 | Disposition: A | Discharge status: Home / Self Care | Payer: Medicare Other | Source: Ambulatory Visit | Attending: Orthopedic Surgery | Admitting: Orthopedic Surgery

## 2014-05-27 DIAGNOSIS — Z01812 Encounter for preprocedural laboratory examination: Secondary | ICD-10-CM | POA: Insufficient documentation

## 2014-05-27 DIAGNOSIS — M1712 Unilateral primary osteoarthritis, left knee: Secondary | ICD-10-CM | POA: Insufficient documentation

## 2014-05-27 HISTORY — DX: Personal history of pulmonary embolism: Z86.711

## 2014-05-27 HISTORY — DX: Cerebral infarction, unspecified: I63.9

## 2014-05-27 HISTORY — DX: Peripheral vascular disease, unspecified: I73.9

## 2014-05-27 HISTORY — DX: Personal history of other medical treatment: Z92.89

## 2014-05-27 HISTORY — DX: Calculus of gallbladder without cholecystitis without obstruction: K80.20

## 2014-05-27 HISTORY — DX: Personal history of urinary calculi: Z87.442

## 2014-05-27 LAB — PROTIME-INR
INR: 1 (ref 0.00–1.49)
Prothrombin Time: 13.3 seconds (ref 11.6–15.2)

## 2014-05-27 LAB — COMPREHENSIVE METABOLIC PANEL
ALK PHOS: 83 U/L (ref 39–117)
ALT: 34 U/L (ref 0–53)
ANION GAP: 7 (ref 5–15)
AST: 33 U/L (ref 0–37)
Albumin: 4.2 g/dL (ref 3.5–5.2)
BILIRUBIN TOTAL: 0.7 mg/dL (ref 0.3–1.2)
BUN: 21 mg/dL (ref 6–23)
CO2: 28 mmol/L (ref 19–32)
CREATININE: 0.92 mg/dL (ref 0.50–1.35)
Calcium: 9.6 mg/dL (ref 8.4–10.5)
Chloride: 103 mmol/L (ref 96–112)
GFR calc non Af Amer: 82 mL/min — ABNORMAL LOW (ref >90)
GLUCOSE: 94 mg/dL (ref 70–99)
POTASSIUM: 4.4 mmol/L (ref 3.5–5.1)
Sodium: 138 mmol/L (ref 135–145)
Total Protein: 7.3 g/dL (ref 6.0–8.3)

## 2014-05-27 LAB — URINALYSIS, ROUTINE W REFLEX MICROSCOPIC
Bilirubin Urine: NEGATIVE
Glucose, UA: NEGATIVE mg/dL
Hgb urine dipstick: NEGATIVE
KETONES UR: NEGATIVE mg/dL
Leukocytes, UA: NEGATIVE
NITRITE: NEGATIVE
Protein, ur: NEGATIVE mg/dL
Specific Gravity, Urine: 1.022 (ref 1.005–1.030)
UROBILINOGEN UA: 0.2 mg/dL (ref 0.0–1.0)
pH: 5.5 (ref 5.0–8.0)

## 2014-05-27 LAB — SURGICAL PCR SCREEN
MRSA, PCR: NEGATIVE
Staphylococcus aureus: POSITIVE — AB

## 2014-05-27 LAB — ABO/RH: ABO/RH(D): A POS

## 2014-05-27 LAB — APTT: APTT: 28 s (ref 24–37)

## 2014-05-27 LAB — CBC
HEMATOCRIT: 43.4 % (ref 39.0–52.0)
Hemoglobin: 14.8 g/dL (ref 13.0–17.0)
MCH: 33.1 pg (ref 26.0–34.0)
MCHC: 34.1 g/dL (ref 30.0–36.0)
MCV: 97.1 fL (ref 78.0–100.0)
Platelets: 286 10*3/uL (ref 150–400)
RBC: 4.47 MIL/uL (ref 4.22–5.81)
RDW: 13 % (ref 11.5–15.5)
WBC: 6.3 10*3/uL (ref 4.0–10.5)

## 2014-06-03 ENCOUNTER — Encounter (HOSPITAL_COMMUNITY): Payer: Self-pay | Admitting: *Deleted

## 2014-06-03 ENCOUNTER — Inpatient Hospital Stay (HOSPITAL_COMMUNITY)
Admission: RE | Admit: 2014-06-03 | Discharge: 2014-06-06 | DRG: 470 | Disposition: A | Discharge status: Home Health | Payer: Medicare Other | Source: Ambulatory Visit | Attending: Orthopedic Surgery | Admitting: Orthopedic Surgery

## 2014-06-03 ENCOUNTER — Inpatient Hospital Stay (HOSPITAL_COMMUNITY): Payer: Medicare Other | Admitting: Certified Registered"

## 2014-06-03 ENCOUNTER — Encounter (HOSPITAL_COMMUNITY)
Admission: RE | Disposition: A | Discharge status: Home Health | Payer: Self-pay | Source: Ambulatory Visit | Attending: Orthopedic Surgery

## 2014-06-03 DIAGNOSIS — Z01812 Encounter for preprocedural laboratory examination: Secondary | ICD-10-CM

## 2014-06-03 DIAGNOSIS — M1712 Unilateral primary osteoarthritis, left knee: Secondary | ICD-10-CM | POA: Diagnosis present

## 2014-06-03 DIAGNOSIS — M21162 Varus deformity, not elsewhere classified, left knee: Secondary | ICD-10-CM | POA: Diagnosis present

## 2014-06-03 DIAGNOSIS — Z8546 Personal history of malignant neoplasm of prostate: Secondary | ICD-10-CM | POA: Diagnosis not present

## 2014-06-03 DIAGNOSIS — Z86711 Personal history of pulmonary embolism: Secondary | ICD-10-CM

## 2014-06-03 DIAGNOSIS — Z8673 Personal history of transient ischemic attack (TIA), and cerebral infarction without residual deficits: Secondary | ICD-10-CM

## 2014-06-03 DIAGNOSIS — M1710 Unilateral primary osteoarthritis, unspecified knee: Secondary | ICD-10-CM | POA: Diagnosis present

## 2014-06-03 DIAGNOSIS — E785 Hyperlipidemia, unspecified: Secondary | ICD-10-CM | POA: Diagnosis present

## 2014-06-03 DIAGNOSIS — M25562 Pain in left knee: Secondary | ICD-10-CM | POA: Diagnosis present

## 2014-06-03 DIAGNOSIS — M179 Osteoarthritis of knee, unspecified: Secondary | ICD-10-CM | POA: Diagnosis present

## 2014-06-03 HISTORY — PX: TOTAL KNEE ARTHROPLASTY: SHX125

## 2014-06-03 LAB — TYPE AND SCREEN
ABO/RH(D): A POS
ANTIBODY SCREEN: NEGATIVE

## 2014-06-03 SURGERY — ARTHROPLASTY, KNEE, TOTAL
Anesthesia: Spinal | Site: Knee | Laterality: Left

## 2014-06-03 MED ORDER — SODIUM CHLORIDE 0.9 % IR SOLN
Status: DC | PRN
  Administered 2014-06-03: 1000 mL

## 2014-06-03 MED ORDER — DOCUSATE SODIUM 100 MG PO CAPS
100.0000 mg | ORAL_CAPSULE | Freq: Two times a day (BID) | ORAL | Status: DC
  Administered 2014-06-03 – 2014-06-06 (×6): 100 mg via ORAL

## 2014-06-03 MED ORDER — FENTANYL CITRATE 0.05 MG/ML IJ SOLN
INTRAMUSCULAR | Status: AC
  Filled 2014-06-03: qty 2

## 2014-06-03 MED ORDER — LACTATED RINGERS IV SOLN
INTRAVENOUS | Status: DC
Start: 2014-06-03 — End: 2014-06-03
  Administered 2014-06-03: 13:00:00 via INTRAVENOUS
  Administered 2014-06-03: 1000 mL via INTRAVENOUS

## 2014-06-03 MED ORDER — 0.9 % SODIUM CHLORIDE (POUR BTL) OPTIME
TOPICAL | Status: DC | PRN
  Administered 2014-06-03: 1000 mL

## 2014-06-03 MED ORDER — SODIUM CHLORIDE 0.9 % IJ SOLN
INTRAMUSCULAR | Status: AC
  Filled 2014-06-03: qty 50

## 2014-06-03 MED ORDER — CEFAZOLIN SODIUM-DEXTROSE 2-3 GM-% IV SOLR
2.0000 g | INTRAVENOUS | Status: AC
  Administered 2014-06-03: 2 g via INTRAVENOUS

## 2014-06-03 MED ORDER — FENTANYL CITRATE 0.05 MG/ML IJ SOLN
INTRAMUSCULAR | Status: DC | PRN
  Administered 2014-06-03 (×2): 50 ug via INTRAVENOUS

## 2014-06-03 MED ORDER — OXYCODONE HCL 5 MG PO TABS
5.0000 mg | ORAL_TABLET | ORAL | Status: DC | PRN
  Administered 2014-06-03 – 2014-06-06 (×12): 10 mg via ORAL
  Filled 2014-06-03 (×13): qty 2

## 2014-06-03 MED ORDER — BISACODYL 10 MG RE SUPP: 10.0000 mg | Freq: Every day | RECTAL | Status: DC | PRN

## 2014-06-03 MED ORDER — SODIUM CHLORIDE 0.9 % IV SOLN
2000.0000 mg | Freq: Once | INTRAVENOUS | Status: DC
  Filled 2014-06-03: qty 20

## 2014-06-03 MED ORDER — LIDOCAINE HCL (CARDIAC) 20 MG/ML IV SOLN
INTRAVENOUS | Status: DC | PRN
  Administered 2014-06-03: 20 mg via INTRAVENOUS

## 2014-06-03 MED ORDER — METOCLOPRAMIDE HCL 5 MG/ML IJ SOLN: 5.0000 mg | Freq: Three times a day (TID) | INTRAMUSCULAR | Status: DC | PRN

## 2014-06-03 MED ORDER — ACETAMINOPHEN 325 MG PO TABS: 650.0000 mg | ORAL_TABLET | Freq: Four times a day (QID) | ORAL | Status: DC | PRN

## 2014-06-03 MED ORDER — BUPIVACAINE HCL 0.25 % IJ SOLN
INTRAMUSCULAR | Status: DC | PRN
  Administered 2014-06-03: 30 mL

## 2014-06-03 MED ORDER — CEFAZOLIN SODIUM-DEXTROSE 2-3 GM-% IV SOLR
2.0000 g | Freq: Four times a day (QID) | INTRAVENOUS | Status: AC
  Administered 2014-06-03 (×2): 2 g via INTRAVENOUS
  Filled 2014-06-03 (×2): qty 50

## 2014-06-03 MED ORDER — METHOCARBAMOL 500 MG PO TABS
500.0000 mg | ORAL_TABLET | Freq: Four times a day (QID) | ORAL | Status: DC | PRN
  Administered 2014-06-03 – 2014-06-06 (×6): 500 mg via ORAL
  Filled 2014-06-03 (×6): qty 1

## 2014-06-03 MED ORDER — METOCLOPRAMIDE HCL 10 MG PO TABS: 5.0000 mg | ORAL_TABLET | Freq: Three times a day (TID) | ORAL | Status: DC | PRN

## 2014-06-03 MED ORDER — BUPIVACAINE HCL (PF) 0.25 % IJ SOLN
INTRAMUSCULAR | Status: AC
  Filled 2014-06-03: qty 30

## 2014-06-03 MED ORDER — ONDANSETRON HCL 4 MG/2ML IJ SOLN: 4.0000 mg | Freq: Four times a day (QID) | INTRAMUSCULAR | Status: DC | PRN

## 2014-06-03 MED ORDER — DIPHENHYDRAMINE HCL 12.5 MG/5ML PO ELIX: 12.5000 mg | ORAL_SOLUTION | ORAL | Status: DC | PRN

## 2014-06-03 MED ORDER — PROPOFOL 10 MG/ML IV BOLUS
INTRAVENOUS | Status: AC
  Filled 2014-06-03: qty 20

## 2014-06-03 MED ORDER — DEXAMETHASONE SODIUM PHOSPHATE 10 MG/ML IJ SOLN
INTRAMUSCULAR | Status: AC
  Filled 2014-06-03: qty 1

## 2014-06-03 MED ORDER — LIDOCAINE HCL (CARDIAC) 20 MG/ML IV SOLN
INTRAVENOUS | Status: AC
  Filled 2014-06-03: qty 5

## 2014-06-03 MED ORDER — ACETAMINOPHEN 650 MG RE SUPP: 650.0000 mg | Freq: Four times a day (QID) | RECTAL | Status: DC | PRN

## 2014-06-03 MED ORDER — ACETAMINOPHEN 10 MG/ML IV SOLN
1000.0000 mg | Freq: Once | INTRAVENOUS | Status: AC
  Administered 2014-06-03: 1000 mg via INTRAVENOUS
  Filled 2014-06-03: qty 100

## 2014-06-03 MED ORDER — RIVAROXABAN 10 MG PO TABS
10.0000 mg | ORAL_TABLET | Freq: Every day | ORAL | Status: DC
  Administered 2014-06-04 – 2014-06-06 (×3): 10 mg via ORAL
  Filled 2014-06-03 (×5): qty 1

## 2014-06-03 MED ORDER — TRAMADOL HCL 50 MG PO TABS: 50.0000 mg | ORAL_TABLET | Freq: Four times a day (QID) | ORAL | Status: DC | PRN

## 2014-06-03 MED ORDER — LACTATED RINGERS IV SOLN: INTRAVENOUS | Status: DC

## 2014-06-03 MED ORDER — SODIUM CHLORIDE 0.9 % IJ SOLN
INTRAMUSCULAR | Status: DC | PRN
  Administered 2014-06-03: 30 mL via INTRAVENOUS

## 2014-06-03 MED ORDER — PHENOL 1.4 % MT LIQD: 1.0000 | OROMUCOSAL | Status: DC | PRN

## 2014-06-03 MED ORDER — BUPIVACAINE IN DEXTROSE 0.75-8.25 % IT SOLN
INTRATHECAL | Status: DC | PRN
  Administered 2014-06-03: 1.8 mL via INTRATHECAL

## 2014-06-03 MED ORDER — DEXAMETHASONE SODIUM PHOSPHATE 10 MG/ML IJ SOLN
10.0000 mg | Freq: Once | INTRAMUSCULAR | Status: AC
  Administered 2014-06-04: 10 mg via INTRAVENOUS
  Filled 2014-06-03: qty 1

## 2014-06-03 MED ORDER — MIDAZOLAM HCL 5 MG/5ML IJ SOLN
INTRAMUSCULAR | Status: DC | PRN
  Administered 2014-06-03: 2 mg via INTRAVENOUS

## 2014-06-03 MED ORDER — BUPIVACAINE LIPOSOME 1.3 % IJ SUSP
INTRAMUSCULAR | Status: DC | PRN
  Administered 2014-06-03: 20 mL

## 2014-06-03 MED ORDER — MENTHOL 3 MG MT LOZG: 1.0000 | LOZENGE | OROMUCOSAL | Status: DC | PRN

## 2014-06-03 MED ORDER — ONDANSETRON HCL 4 MG/2ML IJ SOLN
INTRAMUSCULAR | Status: AC
  Filled 2014-06-03: qty 2

## 2014-06-03 MED ORDER — METHOCARBAMOL 1000 MG/10ML IJ SOLN
500.0000 mg | Freq: Four times a day (QID) | INTRAMUSCULAR | Status: DC | PRN
  Filled 2014-06-03: qty 5

## 2014-06-03 MED ORDER — MORPHINE SULFATE 2 MG/ML IJ SOLN: 1.0000 mg | INTRAMUSCULAR | Status: DC | PRN

## 2014-06-03 MED ORDER — FLEET ENEMA 7-19 GM/118ML RE ENEM: 1.0000 | ENEMA | Freq: Once | RECTAL | Status: AC | PRN

## 2014-06-03 MED ORDER — ONDANSETRON HCL 4 MG PO TABS: 4.0000 mg | ORAL_TABLET | Freq: Four times a day (QID) | ORAL | Status: DC | PRN

## 2014-06-03 MED ORDER — ACETAMINOPHEN 500 MG PO TABS
1000.0000 mg | ORAL_TABLET | Freq: Four times a day (QID) | ORAL | Status: AC
  Administered 2014-06-03 – 2014-06-04 (×3): 1000 mg via ORAL
  Filled 2014-06-03 (×3): qty 2

## 2014-06-03 MED ORDER — DEXAMETHASONE SODIUM PHOSPHATE 10 MG/ML IJ SOLN
10.0000 mg | Freq: Once | INTRAMUSCULAR | Status: AC
  Administered 2014-06-03: 10 mg via INTRAVENOUS

## 2014-06-03 MED ORDER — POLYETHYLENE GLYCOL 3350 17 G PO PACK
17.0000 g | PACK | Freq: Every day | ORAL | Status: DC | PRN
  Administered 2014-06-04 – 2014-06-05 (×2): 17 g via ORAL
  Filled 2014-06-03 (×2): qty 1

## 2014-06-03 MED ORDER — KETOROLAC TROMETHAMINE 15 MG/ML IJ SOLN: 7.5000 mg | Freq: Four times a day (QID) | INTRAMUSCULAR | Status: AC | PRN

## 2014-06-03 MED ORDER — BUPIVACAINE LIPOSOME 1.3 % IJ SUSP
20.0000 mL | Freq: Once | INTRAMUSCULAR | Status: DC
  Filled 2014-06-03: qty 20

## 2014-06-03 MED ORDER — CEFAZOLIN SODIUM-DEXTROSE 2-3 GM-% IV SOLR
INTRAVENOUS | Status: AC
  Filled 2014-06-03: qty 50

## 2014-06-03 MED ORDER — MIDAZOLAM HCL 2 MG/2ML IJ SOLN
INTRAMUSCULAR | Status: AC
  Filled 2014-06-03: qty 2

## 2014-06-03 MED ORDER — SODIUM CHLORIDE 0.9 % IV SOLN: INTRAVENOUS | Status: DC

## 2014-06-03 MED ORDER — PROPOFOL INFUSION 10 MG/ML OPTIME
INTRAVENOUS | Status: DC | PRN
  Administered 2014-06-03: 75 ug/kg/min via INTRAVENOUS

## 2014-06-03 MED ORDER — DEXTROSE-NACL 5-0.9 % IV SOLN
INTRAVENOUS | Status: DC
  Administered 2014-06-03 – 2014-06-04 (×2): via INTRAVENOUS

## 2014-06-03 MED ORDER — HYDROMORPHONE HCL 1 MG/ML IJ SOLN: 0.2500 mg | INTRAMUSCULAR | Status: DC | PRN

## 2014-06-03 MED ORDER — TRANEXAMIC ACID 100 MG/ML IV SOLN
2000.0000 mg | INTRAVENOUS | Status: DC | PRN
  Administered 2014-06-03: 2000 mg via TOPICAL

## 2014-06-03 SURGICAL SUPPLY — 64 items
BAG DECANTER FOR FLEXI CONT (MISCELLANEOUS) ×2 IMPLANT
BAG SPEC THK2 15X12 ZIP CLS (MISCELLANEOUS) ×1
BAG ZIPLOCK 12X15 (MISCELLANEOUS) ×2 IMPLANT
BANDAGE ELASTIC 6 VELCRO ST LF (GAUZE/BANDAGES/DRESSINGS) ×2 IMPLANT
BANDAGE ESMARK 6X9 LF (GAUZE/BANDAGES/DRESSINGS) ×1 IMPLANT
BLADE SAG 18X100X1.27 (BLADE) ×2 IMPLANT
BLADE SAW SGTL 11.0X1.19X90.0M (BLADE) ×2 IMPLANT
BNDG CMPR 9X6 STRL LF SNTH (GAUZE/BANDAGES/DRESSINGS) ×1
BNDG ESMARK 6X9 LF (GAUZE/BANDAGES/DRESSINGS) ×2
BOWL SMART MIX CTS (DISPOSABLE) ×2 IMPLANT
CAP KNEE TOTAL 3 SIGMA ×1 IMPLANT
CEMENT HV SMART SET (Cement) ×4 IMPLANT
CUFF TOURN SGL QUICK 34 (TOURNIQUET CUFF) ×2
CUFF TRNQT CYL 34X4X40X1 (TOURNIQUET CUFF) ×1 IMPLANT
DECANTER SPIKE VIAL GLASS SM (MISCELLANEOUS) ×2 IMPLANT
DRAPE EXTREMITY T 121X128X90 (DRAPE) ×2 IMPLANT
DRAPE POUCH INSTRU U-SHP 10X18 (DRAPES) ×2 IMPLANT
DRAPE U-SHAPE 47X51 STRL (DRAPES) ×2 IMPLANT
DRSG ADAPTIC 3X8 NADH LF (GAUZE/BANDAGES/DRESSINGS) ×2 IMPLANT
DRSG PAD ABDOMINAL 8X10 ST (GAUZE/BANDAGES/DRESSINGS) ×2 IMPLANT
DURAPREP 26ML APPLICATOR (WOUND CARE) ×2 IMPLANT
ELECT REM PT RETURN 9FT ADLT (ELECTROSURGICAL) ×2
ELECTRODE REM PT RTRN 9FT ADLT (ELECTROSURGICAL) ×1 IMPLANT
EVACUATOR 1/8 PVC DRAIN (DRAIN) ×2 IMPLANT
FACESHIELD WRAPAROUND (MASK) ×10 IMPLANT
FACESHIELD WRAPAROUND OR TEAM (MASK) ×5 IMPLANT
GAUZE SPONGE 4X4 12PLY STRL (GAUZE/BANDAGES/DRESSINGS) ×2 IMPLANT
GLOVE BIO SURGEON STRL SZ7.5 (GLOVE) IMPLANT
GLOVE BIO SURGEON STRL SZ8 (GLOVE) ×2 IMPLANT
GLOVE BIOGEL PI IND STRL 6.5 (GLOVE) IMPLANT
GLOVE BIOGEL PI IND STRL 8 (GLOVE) ×1 IMPLANT
GLOVE BIOGEL PI INDICATOR 6.5 (GLOVE)
GLOVE BIOGEL PI INDICATOR 8 (GLOVE) ×1
GLOVE SURG SS PI 6.5 STRL IVOR (GLOVE) IMPLANT
GOWN STRL REUS W/TWL LRG LVL3 (GOWN DISPOSABLE) ×2 IMPLANT
GOWN STRL REUS W/TWL XL LVL3 (GOWN DISPOSABLE) IMPLANT
HANDPIECE INTERPULSE COAX TIP (DISPOSABLE) ×2
IMMOBILIZER KNEE 20 (SOFTGOODS) ×2
IMMOBILIZER KNEE 20 THIGH 36 (SOFTGOODS) ×1 IMPLANT
KIT BASIN OR (CUSTOM PROCEDURE TRAY) ×2 IMPLANT
MANIFOLD NEPTUNE II (INSTRUMENTS) ×2 IMPLANT
NDL SAFETY ECLIPSE 18X1.5 (NEEDLE) ×2 IMPLANT
NEEDLE HYPO 18GX1.5 SHARP (NEEDLE) ×4
NS IRRIG 1000ML POUR BTL (IV SOLUTION) ×2 IMPLANT
PACK TOTAL JOINT (CUSTOM PROCEDURE TRAY) ×2 IMPLANT
PAD ABD 8X10 STRL (GAUZE/BANDAGES/DRESSINGS) ×1 IMPLANT
PADDING CAST COTTON 6X4 STRL (CAST SUPPLIES) ×4 IMPLANT
PEN SKIN MARKING BROAD (MISCELLANEOUS) ×2 IMPLANT
POSITIONER SURGICAL ARM (MISCELLANEOUS) ×2 IMPLANT
SET HNDPC FAN SPRY TIP SCT (DISPOSABLE) ×1 IMPLANT
STRIP CLOSURE SKIN 1/2X4 (GAUZE/BANDAGES/DRESSINGS) ×3 IMPLANT
SUCTION FRAZIER 12FR DISP (SUCTIONS) ×2 IMPLANT
SUT MNCRL AB 4-0 PS2 18 (SUTURE) ×2 IMPLANT
SUT VIC AB 2-0 CT1 27 (SUTURE) ×6
SUT VIC AB 2-0 CT1 TAPERPNT 27 (SUTURE) ×3 IMPLANT
SUT VLOC 180 0 24IN GS25 (SUTURE) ×2 IMPLANT
SYR 20CC LL (SYRINGE) ×2 IMPLANT
SYR 50ML LL SCALE MARK (SYRINGE) ×2 IMPLANT
TOWEL OR 17X26 10 PK STRL BLUE (TOWEL DISPOSABLE) ×2 IMPLANT
TOWEL OR NON WOVEN STRL DISP B (DISPOSABLE) IMPLANT
TRAY FOLEY METER SIL LF 16FR (CATHETERS) ×1 IMPLANT
WATER STERILE IRR 1500ML POUR (IV SOLUTION) ×2 IMPLANT
WRAP KNEE MAXI GEL POST OP (GAUZE/BANDAGES/DRESSINGS) ×2 IMPLANT
YANKAUER SUCT BULB TIP 10FT TU (MISCELLANEOUS) ×2 IMPLANT

## 2014-06-03 NOTE — Anesthesia Postprocedure Evaluation (Signed)
  Anesthesia Post-op Note  Patient: Isaac Powers  Procedure(s) Performed: Procedure(s) (LRB): LEFT TOTAL KNEE ARTHROPLASTY (Left)  Patient Location: PACU  Anesthesia Type: Spinal  Level of Consciousness: awake and alert   Airway and Oxygen Therapy: Patient Spontanous Breathing  Post-op Pain: mild  Post-op Assessment: Post-op Vital signs reviewed, Patient's Cardiovascular Status Stable, Respiratory Function Stable, Patent Airway and No signs of Nausea or vomiting  Last Vitals:  Filed Vitals:   06/03/14 1430  BP: 145/93  Pulse: 56  Temp:   Resp: 16    Post-op Vital Signs: stable   Complications: No apparent anesthesia complications

## 2014-06-03 NOTE — Progress Notes (Signed)
Utilization review completed.  

## 2014-06-03 NOTE — Transfer of Care (Signed)
Immediate Anesthesia Transfer of Care Note  Patient: Isaac Powers  Procedure(s) Performed: Procedure(s) (LRB): LEFT TOTAL KNEE ARTHROPLASTY (Left)  Patient Location: PACU  Anesthesia Type: Spinal  Level of Consciousness: sedated, patient cooperative and responds to stimulation  Airway & Oxygen Therapy: Patient Spontanous Breathing and Patient connected to face mask oxgen  Post-op Assessment: Report given to PACU RN and Post -op Vital signs reviewed and stable  Post vital signs: Reviewed and stable  Complications: No apparent anesthesia complications

## 2014-06-03 NOTE — Anesthesia Preprocedure Evaluation (Addendum)
Anesthesia Evaluation  Patient identified by MRN, date of birth, ID band Patient awake    Reviewed: Allergy & Precautions, H&P , NPO status , Patient's Chart, lab work & pertinent test results  Airway Mallampati: II  TM Distance: >3 FB Neck ROM: full    Dental no notable dental hx. (+) Teeth Intact, Dental Advisory Given   Pulmonary PE breath sounds clear to auscultation  Pulmonary exam normal       Cardiovascular Exercise Tolerance: Good negative cardio ROS  Rhythm:regular Rate:Normal     Neuro/Psych CVA, No Residual Symptoms negative neurological ROS  negative psych ROS   GI/Hepatic negative GI ROS, Neg liver ROS,   Endo/Other  negative endocrine ROS  Renal/GU negative Renal ROS  negative genitourinary   Musculoskeletal   Abdominal   Peds  Hematology negative hematology ROS (+)   Anesthesia Other Findings Prostate ca  Reproductive/Obstetrics negative OB ROS                            Anesthesia Physical Anesthesia Plan  ASA: III  Anesthesia Plan: Spinal   Post-op Pain Management:    Induction:   Airway Management Planned: Simple Face Mask  Additional Equipment:   Intra-op Plan:   Post-operative Plan:   Informed Consent: I have reviewed the patients History and Physical, chart, labs and discussed the procedure including the risks, benefits and alternatives for the proposed anesthesia with the patient or authorized representative who has indicated his/her understanding and acceptance.   Dental Advisory Given  Plan Discussed with: CRNA and Surgeon  Anesthesia Plan Comments:        Anesthesia Quick Evaluation

## 2014-06-03 NOTE — Op Note (Signed)
Pre-operative diagnosis- Osteoarthritis  Left knee(s)  Post-operative diagnosis- Osteoarthritis Left knee(s)  Procedure-  Left  Total Knee Arthroplasty  Surgeon- Dione Plover. Lakitha Gordy, MD  Assistant- Arlee Muslim, PA-C   Anesthesia-  Spinal  EBL-* No blood loss amount entered *   Drains Hemovac  Tourniquet time-  Total Tourniquet Time Documented: Thigh (Left) - 31 minutes Total: Thigh (Left) - 31 minutes     Complications- None  Condition-PACU - hemodynamically stable.   Brief Clinical Note   Isaac Powers is a 74 y.o. year old male with end stage OA of his left knee with progressively worsening pain and dysfunction. He has constant pain, with activity and at rest and significant functional deficits with difficulties even with ADLs. He has had extensive non-op management including analgesics, injections of cortisone, and home exercise program, but remains in significant pain with significant dysfunction. Radiographs show bone on bone arthritis medial and patellofemoral with significant varus deformity. He presents now for left Total Knee Arthroplasty.     Procedure in detail---   The patient is brought into the operating room and positioned supine on the operating table. After successful administration of  Spinal,   a tourniquet is placed high on the  Left thigh(s) and the lower extremity is prepped and draped in the usual sterile fashion. Time out is performed by the operating team and then the  Left lower extremity is wrapped in Esmarch, knee flexed and the tourniquet inflated to 300 mmHg.       A midline incision is made with a ten blade through the subcutaneous tissue to the level of the extensor mechanism. A fresh blade is used to make a medial parapatellar arthrotomy. Soft tissue over the proximal medial tibia is subperiosteally elevated to the joint line with a knife and into the semimembranosus bursa with a Cobb elevator. Soft tissue over the proximal lateral tibia is elevated with  attention being paid to avoiding the patellar tendon on the tibial tubercle. The patella is everted, knee flexed 90 degrees and the ACL and PCL are removed. Findings are bone on bone medial and patellofemoral with massive global osteophytes.        The drill is used to create a starting hole in the distal femur and the canal is thoroughly irrigated with sterile saline to remove the fatty contents. The 5 degree Left  valgus alignment guide is placed into the femoral canal and the distal femoral cutting block is pinned to remove 10 mm off the distal femur. Resection is made with an oscillating saw.      The tibia is subluxed forward and the menisci are removed. The extramedullary alignment guide is placed referencing proximally at the medial aspect of the tibial tubercle and distally along the second metatarsal axis and tibial crest. The block is pinned to remove 15mm off the more deficient medial  side. Resection is made with an oscillating saw. Size 5is the most appropriate size for the tibia and the proximal tibia is prepared with the modular drill and keel punch for that size.      The femoral sizing guide is placed and size 5 is most appropriate. Rotation is marked off the epicondylar axis and confirmed by creating a rectangular flexion gap at 90 degrees. The size 5 cutting block is pinned in this rotation and the anterior, posterior and chamfer cuts are made with the oscillating saw. The intercondylar block is then placed and that cut is made.      Trial size  5 tibial component, trial size 5 posterior stabilized femur and a 15  mm posterior stabilized rotating platform insert trial is placed. Full extension is achieved with excellent varus/valgus and anterior/posterior balance throughout full range of motion. The patella is everted and thickness measured to be 27  mm. Free hand resection is taken to 15 mm, a 41 template is placed, lug holes are drilled, trial patella is placed, and it tracks normally.  Osteophytes are removed off the posterior femur with the trial in place. All trials are removed and the cut bone surfaces prepared with pulsatile lavage. Cement is mixed and once ready for implantation, the size 5 tibial implant, size  5 posterior stabilized femoral component, and the size 41 patella are cemented in place and the patella is held with the clamp. The trial insert is placed and the knee held in full extension. The Exparel (20 ml mixed with 30 ml saline) and .25% Bupivicaine, are injected into the extensor mechanism, posterior capsule, medial and lateral gutters and subcutaneous tissues.  All extruded cement is removed and once the cement is hard the permanent 15 mm posterior stabilized rotating platform insert is placed into the tibial tray.      The wound is copiously irrigated with saline solution and the extensor mechanism closed over a hemovac drain with #1 V-loc suture. The tourniquet is released for a total tourniquet time of 31  minutes. Flexion against gravity is 140 degrees and the patella tracks normally. Subcutaneous tissue is closed with 2.0 vicryl and subcuticular with running 4.0 Monocryl. The incision is cleaned and dried and steri-strips and a bulky sterile dressing are applied. The limb is placed into a knee immobilizer and the patient is awakened and transported to recovery in stable condition.      Please note that a surgical assistant was a medical necessity for this procedure in order to perform it in a safe and expeditious manner. Surgical assistant was necessary to retract the ligaments and vital neurovascular structures to prevent injury to them and also necessary for proper positioning of the limb to allow for anatomic placement of the prosthesis.   Dione Plover Armenta Erskin, MD    06/03/2014, 12:39 PM

## 2014-06-03 NOTE — Anesthesia Procedure Notes (Signed)
Spinal Patient location during procedure: OR End time: 06/03/2014 11:34 AM Staffing Anesthesiologist: Rod Mae Resident/CRNA: Lajuana Carry E Performed by: resident/CRNA  Preanesthetic Checklist Completed: patient identified, site marked, surgical consent, pre-op evaluation, timeout performed, IV checked, risks and benefits discussed and monitors and equipment checked Spinal Block Patient position: sitting Prep: Betadine Patient monitoring: heart rate, continuous pulse ox and blood pressure Location: L3-4 Injection technique: single-shot Needle Needle type: Sprotte  Needle gauge: 24 G Needle length: 10 cm Assessment Sensory level: T6 Additional Notes Expiration date of kit checked and confirmed. First attempt, positive clear CSF, neg heme, neg paresthesia. Patient tolerated procedure well, without complications.

## 2014-06-03 NOTE — Plan of Care (Signed)
Problem: Phase I Progression Outcomes Goal: Initial discharge plan identified Outcome: Completed/Met Date Met:  06/03/14 Pt plans to go home at discharge.

## 2014-06-04 LAB — CBC
HCT: 37.1 % — ABNORMAL LOW (ref 39.0–52.0)
Hemoglobin: 12.7 g/dL — ABNORMAL LOW (ref 13.0–17.0)
MCH: 33 pg (ref 26.0–34.0)
MCHC: 34.2 g/dL (ref 30.0–36.0)
MCV: 96.4 fL (ref 78.0–100.0)
PLATELETS: 276 10*3/uL (ref 150–400)
RBC: 3.85 MIL/uL — AB (ref 4.22–5.81)
RDW: 12.6 % (ref 11.5–15.5)
WBC: 13.9 10*3/uL — ABNORMAL HIGH (ref 4.0–10.5)

## 2014-06-04 LAB — BASIC METABOLIC PANEL
Anion gap: 5 (ref 5–15)
BUN: 14 mg/dL (ref 6–23)
CALCIUM: 8.3 mg/dL — AB (ref 8.4–10.5)
CHLORIDE: 104 mmol/L (ref 96–112)
CO2: 25 mmol/L (ref 19–32)
Creatinine, Ser: 0.79 mg/dL (ref 0.50–1.35)
GFR calc Af Amer: >90 mL/min (ref >90)
GFR, EST NON AFRICAN AMERICAN: 88 mL/min — AB (ref >90)
GLUCOSE: 145 mg/dL — AB (ref 70–99)
Potassium: 4.1 mmol/L (ref 3.5–5.1)
Sodium: 134 mmol/L — ABNORMAL LOW (ref 135–145)

## 2014-06-04 MED ORDER — OXYCODONE HCL 5 MG PO TABS: 5.0000 mg | ORAL_TABLET | ORAL | Status: DC | PRN

## 2014-06-04 MED ORDER — METHOCARBAMOL 500 MG PO TABS
500.0000 mg | ORAL_TABLET | Freq: Four times a day (QID) | ORAL | Status: DC | PRN
Start: 2014-06-04 — End: 2016-05-08

## 2014-06-04 MED ORDER — TRAMADOL HCL 50 MG PO TABS: 50.0000 mg | ORAL_TABLET | Freq: Four times a day (QID) | ORAL | Status: DC | PRN

## 2014-06-04 MED ORDER — RIVAROXABAN 10 MG PO TABS: 10.0000 mg | ORAL_TABLET | Freq: Every day | ORAL | Status: DC
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
## 2014-06-04 NOTE — Evaluation (Signed)
Occupational Therapy Evaluation Patient Details Name: Isaac Powers MRN: [id number] DOB: [birth year] Today's Date: 06/04/2014    History of Present Illness s/p L TKA   Clinical Impression   Pt moving well but min cues needed for safety with how to maneuver walker in the small bathroom space. Will follow on acute to progress ADL independence.     Follow Up Recommendations  No OT follow up;Supervision/Assistance - 24 hour    Equipment Recommendations  3 in 1 bedside comode    Recommendations for Other Services       Precautions / Restrictions Precautions Precautions: Knee Precaution Comments: independent with SLR Required Braces or Orthoses: Knee Immobilizer - Left Knee Immobilizer - Left: Discontinue once straight leg raise with < 10 degree lag Restrictions Weight Bearing Restrictions: No Other Position/Activity Restrictions: WBAT LLE      Mobility Bed Mobility           Transfers Overall transfer level: Needs assistance Equipment used: Rolling walker (2 wheeled) Transfers: Sit to/from Stand Sit to Stand: Min guard;Min assist         General transfer comment: verbal cues for hand placement.    Balance                                            ADL Overall ADL's : Needs assistance/impaired Eating/Feeding: Independent;Sitting   Grooming: Oral care;Min guard;Standing   Upper Body Bathing: Set up;Sitting   Lower Body Bathing: Minimal assistance;Sit to/from stand   Upper Body Dressing : Set up;Sitting   Lower Body Dressing: Moderate assistance;Sit to/from stand   Toilet Transfer: Minimal assistance;Ambulation;BSC;RW Toilet Transfer Details (indicate cue type and reason): min assist to safely manage the walker in the tight bathroom space.  Toileting- Clothing Manipulation and Hygiene: Minimal assistance;Sit to/from stand         General ADL Comments: Educated briefly on AE options but pt's signficant other states she can assist  with LB self care till pt is able. Discussed 3in1 for over the commode as well as shower chair. Pt moves quickly so cues to slow down and not pick up the walker off the floor were required.      Vision     Perception     Praxis      Pertinent Vitals/Pain Pain Assessment: 0-10 Pain Score: 2  Pain Location: L knee Pain Descriptors / Indicators: Sore Pain Intervention(s): Repositioned;Ice applied     Hand Dominance     Extremity/Trunk Assessment Upper Extremity Assessment Upper Extremity Assessment: Overall WFL for tasks assessed          Communication Communication Communication: No difficulties   Cognition Arousal/Alertness: Awake/alert Behavior During Therapy: WFL for tasks assessed/performed Overall Cognitive Status: Within Functional Limits for tasks assessed                     General Comments       Exercises       Shoulder Instructions      Home Living Family/patient expects to be discharged to:: Private residence Living Arrangements: Spouse/significant other Available Help at Discharge: Available PRN/intermittently Type of Home: House Home Access: Stairs to enter [employer] of Steps: 3 Entrance Stairs-Rails: None Home Layout: One level     Bathroom Shower/Tub: Occupational psychologist: Standard     Home Equipment: None (canes in the attic)  Prior Functioning/Environment Level of Independence: Independent             OT Diagnosis: Generalized weakness   OT Problem List: Decreased strength;Decreased knowledge of use of DME or AE   OT Treatment/Interventions: Self-care/ADL training;Patient/family education;Therapeutic activities;DME and/or AE instruction    OT Goals(Current goals can be found in the care plan section) Acute Rehab OT Goals Patient Stated Goal: play golf OT Goal Formulation: With patient Time For Goal Achievement: 06/11/14 Potential to Achieve Goals: Good  OT Frequency: Min  2X/week   Barriers to D/C:            Co-evaluation              End of Session Equipment Utilized During Treatment: Rolling walker  Activity Tolerance: Patient tolerated treatment well Patient left: in chair;with call bell/phone within reach;with family/visitor present   Time: 1030-1058 OT Time Calculation (min): 28 min Charges:  OT General Charges $OT Visit: 1 Procedure OT Evaluation $Initial OT Evaluation Tier I: 1 Procedure OT Treatments $Therapeutic Activity: 8-22 mins G-Codes:    Jules Schick  [phone] 06/04/2014, 1:00 PM

## 2014-06-04 NOTE — Progress Notes (Signed)
CARE MANAGEMENT NOTE 06/04/2014  Patient:  Isaac Powers, Isaac Powers   Account Number:  [id number]  Date Initiated:  06/04/2014  Documentation initiated by:  [facility]  Subjective/Objective Assessment:   LEFT TOTAL KNEE ARTHROPLASTY     Action/Plan:   Anticipated DC Date:  06/05/2014   Anticipated DC Plan:  [place]  CM consult      [facility] Choice  HOME HEALTH   Choice offered to / List presented to:  C-1 Patient   DME arranged  3-N-1  Vassie Moselle      DME agency  [place] arranged  HH-2 PT      [place]   Status of service:  Completed, signed off Medicare Important Message given?   (If response is "NO", the following Medicare IM given date fields will be blank) Date Medicare IM given:   Medicare IM given by:   Date Additional Medicare IM given:   Additional Medicare IM given by:    Discharge Disposition:  [place]  Per UR Regulation:    If discussed at Long Length of Stay Meetings, dates discussed:    Comments:  06/04/2014 1230 NCM spoke to pt and offered choice for [facility]. Pt agreeable to [facility] for [facility]. Pt requested RW and 3n1. Contacted [facility] for DME for home. Jonnie Finner RN CCM Case Mgmt phone [phone]

## 2014-06-04 NOTE — Evaluation (Signed)
Physical Therapy Evaluation Patient Details Name: Isaac Powers MRN: [id number] DOB: 1940-04-21 Today's Date: 06/04/2014   History of Present Illness  s/p L TKA  Clinical Impression  Pt will benefit from PT to address deficits below; should progress well; plan is for home with HHPT, plans to go to his girlfriend's home    Follow Up Recommendations Home health PT    Equipment Recommendations  Rolling walker with 5" wheels    Recommendations for Other Services       Precautions / Restrictions Precautions Precautions: Knee Precaution Comments: I SLR, KI not used at time of PT eval Required Braces or Orthoses: Knee Immobilizer - Left Knee Immobilizer - Left: Discontinue once straight leg raise with < 10 degree lag Restrictions Other Position/Activity Restrictions: WBAT LLE      Mobility  Bed Mobility Overal bed mobility: Needs Assistance Bed Mobility: Supine to Sit     Supine to sit: Supervision     General bed mobility comments: cues for self assist, HOB elevated  Transfers Overall transfer level: Needs assistance Equipment used: Rolling walker (2 wheeled) Transfers: Sit to/from Stand Sit to Stand: Min guard         General transfer comment: cues for UE/LLE placement   Ambulation/Gait Ambulation/Gait assistance: Min guard;Supervision Ambulation Distance (Feet): 120 Feet Assistive device: Rolling walker (2 wheeled) Gait Pattern/deviations: Step-to pattern;Step-through pattern;Antalgic     General Gait Details: cues for sequence, use of UEs for pain control  Stairs            Wheelchair Mobility    Modified Rankin (Stroke Patients Only)       Balance                                             Pertinent Vitals/Pain Pain Assessment: 0-10 Pain Score: 2  Pain Descriptors / Indicators: Aching Pain Intervention(s): Limited activity within patient's tolerance;Monitored during session;Premedicated before session    Home Living  Family/patient expects to be discharged to:: Private residence Living Arrangements: Spouse/significant other Available Help at Discharge: Available PRN/intermittently Type of Home: House Home Access: Stairs to enter Entrance Stairs-Rails: None Entrance Stairs-Number of Steps: 3 Home Layout: One level Home Equipment: None (canes in the attic)      Prior Function Level of Independence: Independent               Hand Dominance        Extremity/Trunk Assessment   Upper Extremity Assessment: Defer to OT evaluation           Lower Extremity Assessment: LLE deficits/detail   LLE Deficits / Details: ankle WFL; knee extension and hip flexion 3/5     Communication   Communication: No difficulties  Cognition Arousal/Alertness: Awake/alert Behavior During Therapy: WFL for tasks assessed/performed Overall Cognitive Status: Within Functional Limits for tasks assessed                      General Comments      Exercises Total Joint Exercises Ankle Circles/Pumps: AROM;Both;10 reps      Assessment/Plan    PT Assessment Patient needs continued PT services  PT Diagnosis Difficulty walking   PT Problem List Decreased range of motion;Decreased strength;Decreased mobility;Decreased knowledge of use of DME  PT Treatment Interventions Functional mobility training;Stair training;Gait training;DME instruction;Therapeutic exercise;Patient/family education   PT Goals (Current goals can be found in  the Care Plan section) Acute Rehab PT Goals Patient Stated Goal: get back to being independent, play golf with less pain PT Goal Formulation: With patient Time For Goal Achievement: 06/08/14 Potential to Achieve Goals: Good    Frequency 7X/week   Barriers to discharge        Co-evaluation               End of Session Equipment Utilized During Treatment: Gait belt Activity Tolerance: Patient tolerated treatment well Patient left: in chair;with call bell/phone  within reach Nurse Communication: Mobility status         Time: 6945-0388 PT Time Calculation (min) (ACUTE ONLY): 29 min   Charges:   PT Evaluation $Initial PT Evaluation Tier I: 1 Procedure PT Treatments $Gait Training: 8-22 mins   PT G Codes:        Isaac Powers 06-13-2014, 10:17 AM

## 2014-06-04 NOTE — Progress Notes (Signed)
Physical Therapy Treatment Patient Details Name: Isaac Powers MRN: [id number] DOB: February 28, 1941 Today's Date: 06-13-14    History of Present Illness s/p L TKA    PT Comments    Pt  Progressing well;  Follow Up Recommendations  Home health PT     Equipment Recommendations  Rolling walker with 5" wheels    Recommendations for Other Services       Precautions / Restrictions Precautions Precautions: Knee Precaution Comments: independent with SLR Required Braces or Orthoses: Knee Immobilizer - Left Knee Immobilizer - Left: Discontinue once straight leg raise with < 10 degree lag Restrictions Weight Bearing Restrictions: No Other Position/Activity Restrictions: WBAT LLE    Mobility  Bed Mobility                  Transfers Overall transfer level: Needs assistance Equipment used: Rolling walker (2 wheeled) Transfers: Sit to/from Stand Sit to Stand: Min guard;Min assist         General transfer comment: verbal cues for hand placement.  Ambulation/Gait                 Stairs            Wheelchair Mobility    Modified Rankin (Stroke Patients Only)       Balance                                    Cognition Arousal/Alertness: Awake/alert Behavior During Therapy: WFL for tasks assessed/performed Overall Cognitive Status: Within Functional Limits for tasks assessed                      Exercises Total Joint Exercises Ankle Circles/Pumps: AROM;Both;10 reps Quad Sets: AROM;Strengthening;Both;10 reps Heel Slides: AROM;AAROM;Left;10 reps Hip ABduction/ADduction: AROM;Left;Strengthening;10 reps Straight Leg Raises: AROM;Strengthening;Left;10 reps Goniometric ROM: ~ 8 to 55*    General Comments        Pertinent Vitals/Pain Pain Assessment: 0-10 Pain Score: 1  Pain Location: L thigh Pain Descriptors / Indicators: Sore Pain Intervention(s): Limited activity within patient's tolerance;Monitored during session     Home Living Family/patient expects to be discharged to:: Private residence Living Arrangements: Spouse/significant other   Type of Home: House Home Access: Stairs to enter Entrance Stairs-Rails: None Home Layout: One level Home Equipment: None (canes in the attic)      Prior Function Level of Independence: Independent          PT Goals (current goals can now be found in the care plan section) Acute Rehab PT Goals Patient Stated Goal: play golf, be active again PT Goal Formulation: With patient Time For Goal Achievement: 06/08/14 Potential to Achieve Goals: Good Progress towards PT goals: Progressing toward goals    Frequency  7X/week    PT Plan Current plan remains appropriate    Co-evaluation             End of Session   Activity Tolerance: Patient tolerated treatment well Patient left: in bed;with call bell/phone within reach     Time: 1546-1605 PT Time Calculation (min) (ACUTE ONLY): 19 min  Charges:  $Therapeutic Exercise: 8-22 mins                    G Codes:      Isaac Powers 13-Jun-2014, 4:41 PM

## 2014-06-04 NOTE — Discharge Instructions (Addendum)
° °Dr. Frank [name] °Total Joint Specialist °[facility] °[address] °[place], [place] [address] °[phone] ° °TOTAL KNEE REPLACEMENT POSTOPERATIVE DIRECTIONS ° ° ° °Knee Rehabilitation, Guidelines Following Surgery  °Results after knee surgery are often greatly improved when you follow the exercise, range of motion and muscle strengthening exercises prescribed by your doctor. Safety measures are also important to protect the knee from further injury. Any time any of these exercises cause you to have increased pain or swelling in your knee joint, decrease the amount until you are comfortable again and slowly increase them. If you have problems or questions, call your caregiver or physical therapist for advice.  ° °HOME CARE INSTRUCTIONS  °Remove items at home which could result in a fall. This includes throw rugs or furniture in walking pathways.  °Continue medications as instructed at time of discharge. °You may have some home medications which will be placed on hold until you complete the course of blood thinner medication.  °You may start showering once you are discharged home but do not submerge the incision under water. Just pat the incision dry and apply a dry gauze dressing on daily. °Walk with walker as instructed.  °You may resume a sexual relationship in one month or when given the OK by  your doctor.  °· Use walker as long as suggested by your caregivers. °· Avoid periods of inactivity such as sitting longer than an hour when not asleep. This helps prevent blood clots.  °You may put full weight on your legs and walk as much as is comfortable.  °You may return to work once you are cleared by your doctor.  °Do not drive a car for 6 weeks or until released by you surgeon.  °· Do not drive while taking narcotics.  °Wear the elastic stockings for three weeks following surgery during the day but you may remove then at night. °Make sure you keep all of your appointments after your  operation with all of your doctors and caregivers. You should call the office at the above phone number and make an appointment for approximately two weeks after the date of your surgery. °Change the dressing daily and reapply a dry dressing each time. °Please pick up a stool softener and laxative for home use as long as you are requiring pain medications. °· ICE to the affected knee every three hours for 30 minutes at a time and then as needed for pain and swelling.  Continue to use ice on the knee for pain and swelling from surgery. You may notice swelling that will progress down to the foot and ankle.  This is normal after surgery.  Elevate the leg when you are not up walking on it.   °It is important for you to complete the blood thinner medication as prescribed by your doctor. °· Continue to use the breathing machine which will help keep your temperature down.  It is common for your temperature to cycle up and down following surgery, especially at night when you are not up moving around and exerting yourself.  The breathing machine keeps your lungs expanded and your temperature down. ° °RANGE OF MOTION AND STRENGTHENING EXERCISES  °Rehabilitation of the knee is important following a knee injury or an operation. After just a few days of immobilization, the muscles of the thigh which control the knee become weakened and shrink (atrophy). Knee exercises are designed to build up the tone and strength of the thigh muscles and to improve knee   motion. Often times heat used for twenty to thirty minutes before working out will loosen up your tissues and help with improving the range of motion but do not use heat for the first two weeks following surgery. These exercises can be done on a training (exercise) mat, on the floor, on a table or on a bed. Use what ever works the best and is most comfortable for you Knee exercises include:  °Leg Lifts - While your knee is still immobilized in a splint or cast, you can do  straight leg raises. Lift the leg to 60 degrees, hold for 3 sec, and slowly lower the leg. Repeat 10-20 times 2-3 times daily. Perform this exercise against resistance later as your knee gets better.  °Quad and Hamstring Sets - Tighten up the muscle on the front of the thigh (Quad) and hold for 5-10 sec. Repeat this 10-20 times hourly. Hamstring sets are done by pushing the foot backward against an object and holding for 5-10 sec. Repeat as with quad sets.  °A rehabilitation program following serious knee injuries can speed recovery and prevent re-injury in the future due to weakened muscles. Contact your doctor or a physical therapist for more information on knee rehabilitation.  ° °SKILLED REHAB INSTRUCTIONS: °If the patient is transferred to a skilled rehab facility following release from the hospital, a list of the current medications will be sent to the facility for the patient to continue.  When discharged from the skilled rehab facility, please have the facility set up the patient's Home Health Physical Therapy prior to being released. Also, the skilled facility will be responsible for providing the patient with their medications at time of release from the facility to include their pain medication, the muscle relaxants, and their blood thinner medication. If the patient is still at the rehab facility at time of the two week follow up appointment, the skilled rehab facility will also need to assist the patient in arranging follow up appointment in our office and any transportation needs. ° °MAKE SURE YOU:  °Understand these instructions.  °Will watch your condition.  °Will get help right away if you are not doing well or get worse.  ° ° °Pick up stool softner and laxative for home use following surgery while on pain medications. °Do not submerge incision under water. °Please use good hand washing techniques while changing dressing each day. °May shower starting three days after surgery. °Please use a clean  towel to pat the incision dry following showers. °Continue to use ice for pain and swelling after surgery. °Do not use any lotions or creams on the incision until instructed by your surgeon. ° °Take Xarelto for two and a half more weeks, then discontinue Xarelto. °Once the patient has completed the blood thinner regimen, then take a Baby 81 mg Aspirin daily for three more weeks. ° ° °Postoperative Constipation Protocol ° °Constipation - defined medically as fewer than three stools per week and severe constipation as less than one stool per week. ° °One of the most common issues patients have following surgery is constipation.  Even if you have a regular bowel pattern at home, your normal regimen is likely to be disrupted due to multiple reasons following surgery.  Combination of anesthesia, postoperative narcotics, change in appetite and fluid intake all can affect your bowels.  In order to avoid complications following surgery, here are some recommendations in order to help you during your recovery period. ° °Colace (docusate) - Pick up an over-the-counter form   of Colace or another stool softener and take twice a day as long as you are requiring postoperative pain medications.  Take with a full glass of water daily.  If you experience loose stools or diarrhea, hold the colace until you stool forms back up.  If your symptoms do not get better within 1 week or if they get worse, check with your doctor.  Dulcolax (bisacodyl) - Pick up over-the-counter and take as directed by the product packaging as needed to assist with the movement of your bowels.  Take with a full glass of water.  Use this product as needed if not relieved by Colace only.   MiraLax (polyethylene glycol) - Pick up over-the-counter to have on hand.  MiraLax is a solution that will increase the amount of water in your bowels to assist with bowel movements.  Take as directed and can mix with a glass of water, juice, soda, coffee, or tea.  Take if  you go more than two days without a movement. Do not use MiraLax more than once per day. Call your doctor if you are still constipated or irregular after using this medication for 7 days in a row.  If you continue to have problems with postoperative constipation, please contact the office for further assistance and recommendations.  If you experience "the worst abdominal pain ever" or develop nausea or vomiting, please contact the office immediatly for further recommendations for treatment.   Information on my medicine - XARELTO (Rivaroxaban)  This medication education was reviewed with me or my healthcare representative as part of my discharge preparation.  The pharmacist that spoke with me during my hospital stay was:  Minda Ditto, [facility]  Why was Xarelto prescribed for you? Xarelto was prescribed for you to reduce the risk of blood clots forming after orthopedic surgery. The medical term for these abnormal blood clots is venous thromboembolism (VTE).  What do you need to know about xarelto ? Take your Xarelto ONCE DAILY at the same time every day. You may take it either with or without food.  If you have difficulty swallowing the tablet whole, you may crush it and mix in applesauce just prior to taking your dose.  Take Xarelto exactly as prescribed by your doctor and DO NOT stop taking Xarelto without talking to the doctor who prescribed the medication.  Stopping without other VTE prevention medication to take the place of Xarelto may increase your risk of developing a clot.  After discharge, you should have regular check-up appointments with your healthcare provider that is prescribing your Xarelto.    What do you do if you miss a dose? If you miss a dose, take it as soon as you remember on the same day then continue your regularly scheduled once daily regimen the next day. Do not take two doses of Xarelto on the same day.   Important Safety Information A possible side effect  of Xarelto is bleeding. You should call your healthcare provider right away if you experience any of the following: ? Bleeding from an injury or your nose that does not stop. ? Unusual colored urine (red or dark brown) or unusual colored stools (red or black). ? Unusual bruising for unknown reasons. ? A serious fall or if you hit your head (even if there is no bleeding).  Some medicines may interact with Xarelto and might increase your risk of bleeding while on Xarelto. To help avoid this, consult your healthcare provider or pharmacist prior to using any  new prescription or non-prescription medications, including herbals, vitamins, non-steroidal anti-inflammatory drugs (NSAIDs) and supplements.  Naprosyn  This website has more information on Xarelto: [email or web address]

## 2014-06-05 LAB — CBC
HEMATOCRIT: 34.2 % — AB (ref 39.0–52.0)
Hemoglobin: 11.8 g/dL — ABNORMAL LOW (ref 13.0–17.0)
MCH: 33.5 pg (ref 26.0–34.0)
MCHC: 34.5 g/dL (ref 30.0–36.0)
MCV: 97.2 fL (ref 78.0–100.0)
Platelets: 273 10*3/uL (ref 150–400)
RBC: 3.52 MIL/uL — AB (ref 4.22–5.81)
RDW: 13.2 % (ref 11.5–15.5)
WBC: 14.2 10*3/uL — ABNORMAL HIGH (ref 4.0–10.5)

## 2014-06-05 LAB — BASIC METABOLIC PANEL
Anion gap: 7 (ref 5–15)
BUN: 14 mg/dL (ref 6–23)
CO2: 26 mmol/L (ref 19–32)
Calcium: 8.8 mg/dL (ref 8.4–10.5)
Chloride: 105 mmol/L (ref 96–112)
Creatinine, Ser: 0.87 mg/dL (ref 0.50–1.35)
GFR calc Af Amer: >90 mL/min (ref >90)
GFR calc non Af Amer: 84 mL/min — ABNORMAL LOW (ref >90)
Glucose, Bld: 115 mg/dL — ABNORMAL HIGH (ref 70–99)
POTASSIUM: 4.1 mmol/L (ref 3.5–5.1)
SODIUM: 138 mmol/L (ref 135–145)

## 2014-06-05 MED ORDER — TAMSULOSIN HCL 0.4 MG PO CAPS
0.4000 mg | ORAL_CAPSULE | Freq: Every day | ORAL | Status: DC
  Administered 2014-06-05 – 2014-06-06 (×2): 0.4 mg via ORAL
  Filled 2014-06-05 (×2): qty 1

## 2014-06-05 NOTE — Progress Notes (Signed)
Occupational Therapy Treatment Patient Details Name: Isaac Powers MRN: [id number] DOB: [birth year] Today's Date: 06/05/2014    History of present illness s/p L TKA   OT comments  Pt completed bathroom transfers at supervision level.  He will have assist for ADLs. Good safety during session.  No further OT needs at this time.    Follow Up Recommendations  No OT follow up;Supervision/Assistance - 24 hour    Equipment Recommendations   (3:1 was delivered)    Recommendations for Other Services      Precautions / Restrictions Precautions Precautions: Knee Precaution Comments: independent with SLR Restrictions Other Position/Activity Restrictions: WBAT LLE       Mobility Bed Mobility   Bed Mobility: Supine to Sit     Supine to sit: Supervision Sit to supine: Supervision   General bed mobility comments: pt managed LLE  Transfers   Equipment used: Rolling walker (2 wheeled) Transfers: Sit to/from Stand Sit to Stand: Supervision         General transfer comment: cues for hand placement    Balance                                   ADL                           Toilet Transfer: Supervision/safety;Ambulation       Tub/ Shower Transfer: Supervision/safety;Walk-in shower;Ambulation     General ADL Comments: cues for hand placement for sit to stand.  Pt had just completed ADL.  Sidestepped through tight space without cues.  Educated on placement of 3:1 in shower and recommended that he have significant other wipe legs/pins after showering to avoid rusting.  Pt will have assist for ADLs--not interested in AE      Vision                     Perception     Praxis      Cognition   Behavior During Therapy: [facility] for tasks assessed/performed Overall Cognitive Status: Within Functional Limits for tasks assessed                       Extremity/Trunk Assessment               Exercises     Shoulder Instructions        General Comments      Pertinent Vitals/ Pain       Pain Score: 1  Pain Location: L thigh Pain Descriptors / Indicators: Sore  Home Living                                          Prior Functioning/Environment              Frequency       Progress Toward Goals  OT Goals(current goals can now be found in the care plan section)  Progress towards OT goals: Goals met/education completed, patient discharged from OT (based on clinical judgment, all goals met)     Plan      Co-evaluation                 End of Session     Activity Tolerance Patient tolerated treatment well   Patient Left  in bed;with call bell/phone within reach   Nurse Communication          Time: [phone] OT Time Calculation (min): 17 min  Charges: OT General Charges $OT Visit: 1 Procedure OT Treatments $Self Care/Home Management : 8-22 mins  Bernadett Milian 06/05/2014, 8:41 AM  Lesle Chris, OTR/L [phone] 06/05/2014

## 2014-06-05 NOTE — Progress Notes (Signed)
Medicare Important Message given? YES  Date Medicare IM given:  06/05/2014 Medicare IM given by: Loyd Salvador  

## 2014-06-05 NOTE — Progress Notes (Signed)
Subjective: 2 Days Post-Op Procedure(s) (LRB): LEFT TOTAL KNEE ARTHROPLASTY (Left) Patient reports pain as 4 on 0-10 scale.   Patient can not void well. Has h/o prostate CA treated witjout surgery.   Objective: Vital signs in last 24 hours: Temp:  [97.7 F (36.5 C)-98.3 F (36.8 C)] 97.7 F (36.5 C) (03/13 0500) Pulse Rate:  [68-76] 68 (03/13 0500) Resp:  [16-18] 16 (03/13 0500) BP: (144-165)/(79-85) 159/85 mmHg (03/13 0500) SpO2:  [94 %-97 %] 97 % (03/13 0500)  Intake/Output from previous day: 03/12 0701 - 03/13 0700 In: 2445 [P.O.:960; I.V.:1485] Out: 975 [Urine:975] Intake/Output this shift: Total I/O In: 240 [P.O.:240] Out: 125 [Urine:125]   Recent Labs  06/04/14 0447 06/05/14 0530  HGB 12.7* 11.8*    Recent Labs  06/04/14 0447 06/05/14 0530  WBC 13.9* 14.2*  RBC 3.85* 3.52*  HCT 37.1* 34.2*  PLT 276 273    Recent Labs  06/04/14 0447 06/05/14 0530  NA 134* 138  K 4.1 4.1  CL 104 105  CO2 25 26  BUN 14 14  CREATININE 0.79 0.87  GLUCOSE 145* 115*  CALCIUM 8.3* 8.8   No results for input(s): LABPT, INR in the last 72 hours.  Incision: no drainagedressing chaanged all looks good.  Assessment/Plan: 2 Days Post-Op Procedure(s) (LRB): LEFT TOTAL KNEE ARTHROPLASTY (Left) Up with therapy Will add flomax and I/O cath has 400cc on bladder scan.  Follow today if can void well later then home in am . If persist consult his Urologist tomorrow.  Isaac Powers ANDREW 06/05/2014, 9:53 AM

## 2014-06-05 NOTE — Progress Notes (Signed)
Physical Therapy Treatment Patient Details Name: Isaac Powers MRN: [id number] DOB: 02-23-1941 Today's Date: 06/05/2014    History of Present Illness s/p L TKA    PT Comments    Pt is progressing well with mobility. He walked 300' with RW and performed L TKA exercises well. It is expected he'll be ready to DC home tomorrow morning.   Follow Up Recommendations  Home health PT     Equipment Recommendations  Rolling walker with 5" wheels    Recommendations for Other Services       Precautions / Restrictions Precautions Precautions: Knee Precaution Comments: independent with SLR Restrictions Other Position/Activity Restrictions: WBAT LLE    Mobility  Bed Mobility Overal bed mobility: Modified Independent Bed Mobility: Sit to Supine     Supine to sit: Independent     General bed mobility comments: assist to initiate RLE movement  Transfers Overall transfer level: Needs assistance Equipment used: Rolling walker (2 wheeled) Transfers: Sit to/from Stand Sit to Stand: Modified independent (Device/Increase time)         General transfer comment: good hand placement  Ambulation/Gait Ambulation/Gait assistance: Supervision Ambulation Distance (Feet): 300 Feet Assistive device: Rolling walker (2 wheeled) Gait Pattern/deviations: Step-to pattern;Step-through pattern   Gait velocity interpretation: Below normal speed for age/gender General Gait Details: cues for sequence, step through   Stairs Stairs: Yes Stairs assistance: Min guard Stair Management: One rail Right;With crutches;Step to pattern;Forwards Number of Stairs: 4 General stair comments: cues for sequence  Wheelchair Mobility    Modified Rankin (Stroke Patients Only)       Balance                                    Cognition Arousal/Alertness: Awake/alert Behavior During Therapy: WFL for tasks assessed/performed Overall Cognitive Status: Within Functional Limits for tasks  assessed                      Exercises Total Joint Exercises Ankle Circles/Pumps: AROM;Both;10 reps Quad Sets: AROM;Strengthening;Both;10 reps Short Arc QuadSinclair Ship;Left;10 reps Heel Slides: AROM;AAROM;Left;10 reps Straight Leg Raises: AROM;Strengthening;Left;10 reps Goniometric ROM: ~-5 to 50*    General Comments        Pertinent Vitals/Pain Pain Assessment: 0-10 Pain Score: 4  Pain Location: L knee/thigh with walking Pain Descriptors / Indicators: Sore Pain Intervention(s): Limited activity within patient's tolerance;Monitored during session;Premedicated before session;Ice applied    Home Living                      Prior Function            PT Goals (current goals can now be found in the care plan section) Acute Rehab PT Goals Patient Stated Goal: play golf, be active again PT Goal Formulation: With patient Time For Goal Achievement: 06/08/14 Potential to Achieve Goals: Good Progress towards PT goals: Progressing toward goals    Frequency  7X/week    PT Plan Current plan remains appropriate    Co-evaluation             End of Session Equipment Utilized During Treatment: Gait belt Activity Tolerance: Patient tolerated treatment well Patient left: with call bell/phone within reach;in bed;with family/visitor present     Time: 1240-1311 PT Time Calculation (min) (ACUTE ONLY): 31 min  Charges:  $Gait Training: 8-22 mins $Therapeutic Exercise: 8-22 mins  G Codes:      Blondell Reveal Kistler 06/05/2014, 1:18 PM [phone]

## 2014-06-05 NOTE — Progress Notes (Signed)
Physical Therapy Treatment Patient Details Name: Isaac Powers MRN: [id number] DOB: [birth year] Today's Date: June 21, 2014    History of Present Illness s/p L TKA    PT Comments    Pt making excellent progress; ready for D/C but having issues with urinary retention today  Follow Up Recommendations  Home health PT     Equipment Recommendations  Rolling walker with 5" wheels    Recommendations for Other Services       Precautions / Restrictions Precautions Precautions: Knee Precaution Comments: independent with SLR Restrictions Other Position/Activity Restrictions: WBAT LLE    Mobility  Bed Mobility Overal bed mobility: Needs Assistance Bed Mobility: Supine to Sit     Supine to sit: Min guard Sit to supine: Supervision   General bed mobility comments: assist to initiate RLE movement  Transfers Overall transfer level: Needs assistance Equipment used: Rolling walker (2 wheeled) Transfers: Sit to/from Stand Sit to Stand: Supervision;Min guard         General transfer comment: cues for hand placement  Ambulation/Gait Ambulation/Gait assistance: Min guard;Supervision Ambulation Distance (Feet): 160 Feet Assistive device: Rolling walker (2 wheeled) Gait Pattern/deviations: Step-to pattern;Step-through pattern     General Gait Details: cues for sequence, step through   Stairs Stairs: Yes Stairs assistance: Min guard Stair Management: One rail Right;With crutches;Step to pattern;Forwards Number of Stairs: 4 General stair comments: cues for sequence  Wheelchair Mobility    Modified Rankin (Stroke Patients Only)       Balance                                    Cognition Arousal/Alertness: Awake/alert Behavior During Therapy: WFL for tasks assessed/performed Overall Cognitive Status: Within Functional Limits for tasks assessed                      Exercises Total Joint Exercises Ankle Circles/Pumps: AROM;Both;10 reps Quad  Sets: AROM;Strengthening;Both;10 reps Heel Slides: AROM;AAROM;Left;10 reps Straight Leg Raises: AROM;Strengthening;Left;10 reps Goniometric ROM: ~-5 to 50*    General Comments        Pertinent Vitals/Pain Pain Assessment: 0-10 Pain Score: 1  Pain Location: L thigh Pain Descriptors / Indicators: Discomfort Pain Intervention(s): Limited activity within patient's tolerance;Monitored during session;Repositioned;Ice applied    Home Living                      Prior Function            PT Goals (current goals can now be found in the care plan section) Acute Rehab PT Goals Patient Stated Goal: play golf, be active again PT Goal Formulation: With patient Time For Goal Achievement: 06/08/14 Potential to Achieve Goals: Good Progress towards PT goals: Progressing toward goals    Frequency  7X/week    PT Plan Current plan remains appropriate    Co-evaluation             End of Session Equipment Utilized During Treatment: Gait belt Activity Tolerance: Patient tolerated treatment well Patient left: in chair;with call bell/phone within reach     Time: 0937-1002 PT Time Calculation (min) (ACUTE ONLY): 25 min  Charges:  $Gait Training: 8-22 mins $Therapeutic Exercise: 8-22 mins                    G Codes:      Orie Baxendale 06/21/14, 10:07 AM

## 2014-06-06 ENCOUNTER — Encounter (HOSPITAL_COMMUNITY): Payer: Self-pay | Admitting: Orthopedic Surgery

## 2014-06-06 LAB — CBC
HCT: 32.4 % — ABNORMAL LOW (ref 39.0–52.0)
Hemoglobin: 10.9 g/dL — ABNORMAL LOW (ref 13.0–17.0)
MCH: 32.8 pg (ref 26.0–34.0)
MCHC: 33.6 g/dL (ref 30.0–36.0)
MCV: 97.6 fL (ref 78.0–100.0)
Platelets: 252 10*3/uL (ref 150–400)
RBC: 3.32 MIL/uL — ABNORMAL LOW (ref 4.22–5.81)
RDW: 13.4 % (ref 11.5–15.5)
WBC: 9.4 10*3/uL (ref 4.0–10.5)

## 2014-06-06 MED ORDER — TAMSULOSIN HCL 0.4 MG PO CAPS: 0.4000 mg | ORAL_CAPSULE | Freq: Every day | ORAL | Status: DC
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
## 2014-06-06 NOTE — Progress Notes (Signed)
Physical Therapy Treatment Patient Details Name: Isaac Powers MRN: [id number] DOB: February 20, 1941 Today's Date: 06/10/14    History of Present Illness s/p L TKA    PT Comments    Progressing well  Follow Up Recommendations  Home health PT     Equipment Recommendations  Rolling walker with 5" wheels    Recommendations for Other Services       Precautions / Restrictions Precautions Precautions: Knee Precaution Comments: independent with SLR Restrictions Other Position/Activity Restrictions: WBAT LLE    Mobility  Bed Mobility Overal bed mobility: Modified Independent Bed Mobility: Supine to Sit     Supine to sit: Modified independent (Device/Increase time)     General bed mobility comments: pt self assisting LLE with RLE, HOB elevated  Transfers Overall transfer level: Needs assistance Equipment used: Rolling walker (2 wheeled) Transfers: Sit to/from Stand Sit to Stand: Supervision;Modified independent (Device/Increase time)         General transfer comment: cues to back up to chair/safety  Ambulation/Gait Ambulation/Gait assistance: Supervision;Modified independent (Device/Increase time) Ambulation Distance (Feet): 320 Feet Assistive device: Rolling walker (2 wheeled) Gait Pattern/deviations: Step-to pattern;Step-through pattern;Decreased step length - right;Decreased step length - left;Antalgic     General Gait Details: cues for sequence, step through   Stairs            Wheelchair Mobility    Modified Rankin (Stroke Patients Only)       Balance                                    Cognition Arousal/Alertness: Awake/alert Behavior During Therapy: WFL for tasks assessed/performed Overall Cognitive Status: Within Functional Limits for tasks assessed                      Exercises Total Joint Exercises Ankle Circles/Pumps: AROM;Both;10 reps Quad Sets: AROM;Strengthening;Both;10 reps Short Arc QuadSinclair Powers;Left;10  reps Heel Slides: AROM;AAROM;Left;10 reps Hip ABduction/ADduction: AROM;Left;Strengthening;10 reps Straight Leg Raises: AROM;Strengthening;Left;10 reps Goniometric ROM: 10 to 55* flexion    General Comments        Pertinent Vitals/Pain Pain Assessment: 0-10 Pain Score: 2  Pain Location: L thigh and knee with flexion Pain Descriptors / Indicators: Sore Pain Intervention(s): Limited activity within patient's tolerance;Monitored during session;Premedicated before session;Ice applied;Repositioned    Home Living                      Prior Function            PT Goals (current goals can now be found in the care plan section) Acute Rehab PT Goals Patient Stated Goal: play golf, be active again PT Goal Formulation: With patient Time For Goal Achievement: 06/08/14 Potential to Achieve Goals: Good Progress towards PT goals: Progressing toward goals    Frequency  7X/week    PT Plan Current plan remains appropriate    Co-evaluation             End of Session   Activity Tolerance: Patient tolerated treatment well Patient left: with call bell/phone within reach;with family/visitor present;in chair     Time: 2229-7989 PT Time Calculation (min) (ACUTE ONLY): 41 min  Charges:  $Gait Training: 23-37 mins $Therapeutic Exercise: 8-22 mins                    G Codes:      Isaac Powers Jun 10, 2014, 9:45 AM

## 2015-01-18 ENCOUNTER — Other Ambulatory Visit: Payer: Self-pay | Admitting: Internal Medicine

## 2016-02-09 ENCOUNTER — Other Ambulatory Visit: Payer: Self-pay | Admitting: Internal Medicine

## 2016-05-08 ENCOUNTER — Ambulatory Visit (INDEPENDENT_AMBULATORY_CARE_PROVIDER_SITE_OTHER): Payer: Medicare Other | Admitting: Internal Medicine

## 2016-05-08 ENCOUNTER — Encounter: Payer: Self-pay | Admitting: Internal Medicine

## 2016-05-08 VITALS — BP 140/88 | HR 65 | Temp 98.2°F | Ht 68.5 in | Wt 215.0 lb

## 2016-05-08 DIAGNOSIS — E785 Hyperlipidemia, unspecified: Secondary | ICD-10-CM | POA: Diagnosis not present

## 2016-05-08 DIAGNOSIS — L408 Other psoriasis: Secondary | ICD-10-CM

## 2016-05-08 DIAGNOSIS — Z0000 Encounter for general adult medical examination without abnormal findings: Secondary | ICD-10-CM | POA: Diagnosis not present

## 2016-05-08 DIAGNOSIS — C61 Malignant neoplasm of prostate: Secondary | ICD-10-CM

## 2016-05-08 DIAGNOSIS — Z7189 Other specified counseling: Secondary | ICD-10-CM | POA: Insufficient documentation

## 2016-05-08 DIAGNOSIS — M1610 Unilateral primary osteoarthritis, unspecified hip: Secondary | ICD-10-CM

## 2016-05-08 LAB — COMPREHENSIVE METABOLIC PANEL
ALT: 31 U/L (ref 0–53)
AST: 26 U/L (ref 0–37)
Albumin: 4.7 g/dL (ref 3.5–5.2)
Alkaline Phosphatase: 89 U/L (ref 39–117)
BILIRUBIN TOTAL: 0.8 mg/dL (ref 0.2–1.2)
BUN: 16 mg/dL (ref 6–23)
CO2: 30 mEq/L (ref 19–32)
CREATININE: 0.97 mg/dL (ref 0.40–1.50)
Calcium: 9.7 mg/dL (ref 8.4–10.5)
Chloride: 103 mEq/L (ref 96–112)
GFR: 80.4 mL/min (ref >60.00)
Glucose, Bld: 95 mg/dL (ref 70–99)
Potassium: 4.5 mEq/L (ref 3.5–5.1)
SODIUM: 138 meq/L (ref 135–145)
TOTAL PROTEIN: 7.2 g/dL (ref 6.0–8.3)

## 2016-05-08 LAB — CBC WITH DIFFERENTIAL/PLATELET
Basophils Absolute: 0.1 10*3/uL (ref 0.0–0.1)
Basophils Relative: 1.1 % (ref 0.0–3.0)
EOS ABS: 0.1 10*3/uL (ref 0.0–0.7)
Eosinophils Relative: 2.3 % (ref 0.0–5.0)
HCT: 44.9 % (ref 39.0–52.0)
Hemoglobin: 15.6 g/dL (ref 13.0–17.0)
Lymphocytes Relative: 38.3 % (ref 12.0–46.0)
Lymphs Abs: 2.3 10*3/uL (ref 0.7–4.0)
MCHC: 34.8 g/dL (ref 30.0–36.0)
MCV: 98.4 fl (ref 78.0–100.0)
MONO ABS: 0.5 10*3/uL (ref 0.1–1.0)
Monocytes Relative: 9.1 % (ref 3.0–12.0)
Neutro Abs: 2.9 10*3/uL (ref 1.4–7.7)
Neutrophils Relative %: 49.2 % (ref 43.0–77.0)
Platelets: 278 10*3/uL (ref 150.0–400.0)
RBC: 4.56 Mil/uL (ref 4.22–5.81)
RDW: 13.4 % (ref 11.5–15.5)
WBC: 6 10*3/uL (ref 4.0–10.5)

## 2016-05-08 LAB — LIPID PANEL
CHOLESTEROL: 274 mg/dL — AB (ref 0–200)
HDL: 53.3 mg/dL (ref >39.00)
LDL CALC: 191 mg/dL — AB (ref 0–99)
NonHDL: 220.76
TRIGLYCERIDES: 149 mg/dL (ref 0.0–149.0)
Total CHOL/HDL Ratio: 5
VLDL: 29.8 mg/dL (ref 0.0–40.0)

## 2016-05-08 LAB — PSA: PSA: 9.54 ng/mL — ABNORMAL HIGH (ref 0.10–4.00)

## 2016-05-08 NOTE — Assessment & Plan Note (Signed)
He doesn't want medication for this

## 2016-05-08 NOTE — Patient Instructions (Signed)
DASH Eating Plan DASH stands for "Dietary Approaches to Stop Hypertension." The DASH eating plan is a healthy eating plan that has been shown to reduce high blood pressure (hypertension). Additional health benefits may include reducing the risk of type 2 diabetes mellitus, heart disease, and stroke. The DASH eating plan may also help with weight loss. What do I need to know about the DASH eating plan? For the DASH eating plan, you will follow these general guidelines:  Choose foods with less than 150 milligrams of sodium per serving (as listed on the food label).  Use salt-free seasonings or herbs instead of table salt or sea salt.  Check with your health care provider or pharmacist before using salt substitutes.  Eat lower-sodium products. These are often labeled as "low-sodium" or "no salt added."  Eat fresh foods. Avoid eating a lot of canned foods.  Eat more vegetables, fruits, and low-fat dairy products.  Choose whole grains. Look for the word "whole" as the first word in the ingredient list.  Choose fish and skinless chicken or turkey more often than red meat. Limit fish, poultry, and meat to 6 oz (170 g) each day.  Limit sweets, desserts, sugars, and sugary drinks.  Choose heart-healthy fats.  Eat more home-cooked food and less restaurant, buffet, and fast food.  Limit fried foods.  Do not fry foods. Cook foods using methods such as baking, boiling, grilling, and broiling instead.  When eating at a restaurant, ask that your food be prepared with less salt, or no salt if possible. What foods can I eat? Seek help from a dietitian for individual calorie needs. Grains  Whole grain or whole wheat bread. Brown rice. Whole grain or whole wheat pasta. Quinoa, bulgur, and whole grain cereals. Low-sodium cereals. Corn or whole wheat flour tortillas. Whole grain cornbread. Whole grain crackers. Low-sodium crackers. Vegetables  Fresh or frozen vegetables (raw, steamed, roasted, or  grilled). Low-sodium or reduced-sodium tomato and vegetable juices. Low-sodium or reduced-sodium tomato sauce and paste. Low-sodium or reduced-sodium canned vegetables. Fruits  All fresh, canned (in natural juice), or frozen fruits. Meat and Other Protein Products  Ground beef (85% or leaner), grass-fed beef, or beef trimmed of fat. Skinless chicken or turkey. Ground chicken or turkey. Pork trimmed of fat. All fish and seafood. Eggs. Dried beans, peas, or lentils. Unsalted nuts and seeds. Unsalted canned beans. Dairy  Low-fat dairy products, such as skim or 1% milk, 2% or reduced-fat cheeses, low-fat ricotta or cottage cheese, or plain low-fat yogurt. Low-sodium or reduced-sodium cheeses. Fats and Oils  Tub margarines without trans fats. Light or reduced-fat mayonnaise and salad dressings (reduced sodium). Avocado. Safflower, olive, or canola oils. Natural peanut or almond butter. Other  Unsalted popcorn and pretzels. The items listed above may not be a complete list of recommended foods or beverages. Contact your dietitian for more options.  What foods are not recommended? Grains  White bread. White pasta. White rice. Refined cornbread. Bagels and croissants. Crackers that contain trans fat. Vegetables  Creamed or fried vegetables. Vegetables in a cheese sauce. Regular canned vegetables. Regular canned tomato sauce and paste. Regular tomato and vegetable juices. Fruits  Canned fruit in light or heavy syrup. Fruit juice. Meat and Other Protein Products  Fatty cuts of meat. Ribs, chicken wings, bacon, sausage, bologna, salami, chitterlings, fatback, hot dogs, bratwurst, and packaged luncheon meats. Salted nuts and seeds. Canned beans with salt. Dairy  Whole or 2% milk, cream, half-and-half, and cream cheese. Whole-fat or sweetened yogurt. Full-fat cheeses   or blue cheese. Nondairy creamers and whipped toppings. Processed cheese, cheese spreads, or cheese curds. Condiments  Onion and garlic  salt, seasoned salt, table salt, and sea salt. Canned and packaged gravies. Worcestershire sauce. Tartar sauce. Barbecue sauce. Teriyaki sauce. Soy sauce, including reduced sodium. Steak sauce. Fish sauce. Oyster sauce. Cocktail sauce. Horseradish. Ketchup and mustard. Meat flavorings and tenderizers. Bouillon cubes. Hot sauce. Tabasco sauce. Marinades. Taco seasonings. Relishes. Fats and Oils  Butter, stick margarine, lard, shortening, ghee, and bacon fat. Coconut, palm kernel, or palm oils. Regular salad dressings. Other  Pickles and olives. Salted popcorn and pretzels. The items listed above may not be a complete list of foods and beverages to avoid. Contact your dietitian for more information.  Where can I find more information? National Heart, Lung, and Blood Institute: www.nhlbi.nih.gov/health/health-topics/topics/dash/ This information is not intended to replace advice given to you by your health care provider. Make sure you discuss any questions you have with your health care provider. Document Released: 02/28/2011 Document Revised: 08/17/2015 Document Reviewed: 01/13/2013 Elsevier Interactive Patient Education  2017 Elsevier Inc.  
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
## 2016-05-08 NOTE — Assessment & Plan Note (Signed)
I have personally reviewed the Medicare Annual Wellness questionnaire and have noted 1. The patient's medical and social history 2. Their use of alcohol, tobacco or illicit drugs 3. Their current medications and supplements 4. The patient's functional ability including ADL's, fall risks, home safety risks and hearing or visual             impairment. 5. Diet and physical activities 6. Evidence for depression or mood disorders  The patients weight, height, BMI and visual acuity have been recorded in the chart I have made referrals, counseling and provided education to the patient based review of the above and I have provided the pt with a written personalized care plan for preventive services.  I have provided you with a copy of your personalized plan for preventive services. Please take the time to review along with your updated medication list.  Prefers no flu vaccine Discussed fitness Colonoscopy due 2020 DASH info given

## 2016-05-08 NOTE — Assessment & Plan Note (Signed)
Ready for surgery Will send back to Dr Maureen Ralphs

## 2016-05-08 NOTE — Assessment & Plan Note (Signed)
See social history 

## 2016-05-08 NOTE — Assessment & Plan Note (Signed)
Uses sea water and keeps it under some control

## 2016-05-08 NOTE — Progress Notes (Signed)
Pre visit review using our clinic review tool, if applicable. No additional management support is needed unless otherwise documented below in the visit note. 

## 2016-05-08 NOTE — Assessment & Plan Note (Signed)
He continues to use his OTC product from [place] Will check PSA

## 2016-05-13 ENCOUNTER — Telehealth: Payer: Self-pay | Admitting: Internal Medicine

## 2016-05-13 DIAGNOSIS — C61 Malignant neoplasm of prostate: Secondary | ICD-10-CM

## 2016-05-13 NOTE — Telephone Encounter (Signed)
Spoke to patient about the significant elevation of the PSA. He also states that he can "feel something down there" Will set up CT scan and urology appt---has seen Dr Jacqlyn Larsen in past but doesn't want to go to [facility]

## 2016-05-15 ENCOUNTER — Ambulatory Visit (INDEPENDENT_AMBULATORY_CARE_PROVIDER_SITE_OTHER)
Admission: RE | Admit: 2016-05-15 | Discharge: 2016-05-15 | Disposition: A | Discharge status: Home / Self Care | Payer: Medicare Other | Source: Ambulatory Visit | Attending: Internal Medicine | Admitting: Internal Medicine

## 2016-05-15 DIAGNOSIS — Z8546 Personal history of malignant neoplasm of prostate: Secondary | ICD-10-CM | POA: Diagnosis not present

## 2016-05-15 DIAGNOSIS — R9721 Rising PSA following treatment for malignant neoplasm of prostate: Secondary | ICD-10-CM | POA: Diagnosis not present

## 2016-05-15 DIAGNOSIS — C61 Malignant neoplasm of prostate: Secondary | ICD-10-CM

## 2016-05-15 DIAGNOSIS — N402 Nodular prostate without lower urinary tract symptoms: Secondary | ICD-10-CM | POA: Diagnosis not present

## 2016-05-15 DIAGNOSIS — K8020 Calculus of gallbladder without cholecystitis without obstruction: Secondary | ICD-10-CM | POA: Diagnosis not present

## 2016-05-15 MED ORDER — IOPAMIDOL (ISOVUE-300) INJECTION 61%
100.0000 mL | Freq: Once | INTRAVENOUS | Status: AC | PRN
  Administered 2016-05-15: 100 mL via INTRAVENOUS

## 2016-05-16 ENCOUNTER — Encounter: Payer: Self-pay | Admitting: Internal Medicine

## 2016-05-24 DIAGNOSIS — M1651 Unilateral post-traumatic osteoarthritis, right hip: Secondary | ICD-10-CM | POA: Diagnosis not present

## 2016-05-30 DIAGNOSIS — C61 Malignant neoplasm of prostate: Secondary | ICD-10-CM | POA: Diagnosis not present

## 2016-06-06 DIAGNOSIS — M1611 Unilateral primary osteoarthritis, right hip: Secondary | ICD-10-CM | POA: Diagnosis not present

## 2016-06-11 DIAGNOSIS — Z8546 Personal history of malignant neoplasm of prostate: Secondary | ICD-10-CM | POA: Diagnosis not present

## 2016-07-08 DIAGNOSIS — C61 Malignant neoplasm of prostate: Secondary | ICD-10-CM | POA: Diagnosis not present

## 2016-07-09 ENCOUNTER — Other Ambulatory Visit: Payer: Self-pay | Admitting: Urology

## 2016-07-18 DIAGNOSIS — M1651 Unilateral post-traumatic osteoarthritis, right hip: Secondary | ICD-10-CM | POA: Diagnosis not present

## 2016-08-01 ENCOUNTER — Other Ambulatory Visit: Payer: Self-pay | Admitting: Urology

## 2016-08-01 DIAGNOSIS — M6281 Muscle weakness (generalized): Secondary | ICD-10-CM | POA: Diagnosis not present

## 2016-08-01 DIAGNOSIS — C61 Malignant neoplasm of prostate: Secondary | ICD-10-CM | POA: Diagnosis not present

## 2016-08-01 DIAGNOSIS — M62838 Other muscle spasm: Secondary | ICD-10-CM | POA: Diagnosis not present

## 2016-08-01 DIAGNOSIS — L905 Scar conditions and fibrosis of skin: Secondary | ICD-10-CM | POA: Diagnosis not present

## 2016-08-05 NOTE — Patient Instructions (Signed)
Raffaele Derise  08/05/2016   Your procedure is scheduled on: 08-09-16   Report to [facility] to 3rd floor to [place] at 05:15 AM.    Call this number if you have problems the morning of surgery [phone]    Remember: ONLY 1 PERSON MAY GO WITH YOU TO SHORT STAY TO GET  READY MORNING OF [place].  Do not eat food or drink liquids :After Midnight.     Take these medicines the morning of surgery with A SIP OF WATER: None                               You may not have any metal on your body including hair pins and              piercings  Do not wear jewelry, make-up, lotions, powders or perfumes, deodorant             Men may shave face and neck.   Do not bring valuables to the hospital. [place].  Contacts, dentures or bridgework may not be worn into surgery.  Leave suitcase in the car. After surgery it may be brought to your room.                 Please read over the following fact sheets you were given: _____________________________________________________________________   [facility] - Preparing for Surgery Before surgery, you can play an important role.  Because skin is not sterile, your skin needs to be as free of germs as possible.  You can reduce the number of germs on your skin by washing with CHG (chlorahexidine gluconate) soap before surgery.  CHG is an antiseptic cleaner which kills germs and bonds with the skin to continue killing germs even after washing. Please DO NOT use if you have an allergy to CHG or antibacterial soaps.  If your skin becomes reddened/irritated stop using the CHG and inform your nurse when you arrive at Short Stay. Do not shave (including legs and underarms) for at least 48 hours prior to the first CHG shower.  You may shave your face/neck. Please follow these instructions carefully:  1.  Shower with CHG Soap the night before  surgery and the  morning of Surgery.  2.  If you choose to wash your hair, wash your hair first as usual with your  normal  shampoo.  3.  After you shampoo, rinse your hair and body thoroughly to remove the  shampoo.                           4.  Use CHG as you would any other liquid soap.  You can apply chg directly  to the skin and wash                       Gently with a scrungie or clean washcloth.  5.  Apply the CHG Soap to your body ONLY FROM THE NECK DOWN.   Do not use on face/ open  Wound or open sores. Avoid contact with eyes, ears mouth and genitals (private parts).                       Wash face,  Genitals (private parts) with your normal soap.             6.  Wash thoroughly, paying special attention to the area where your surgery  will be performed.  7.  Thoroughly rinse your body with warm water from the neck down.  8.  DO NOT shower/wash with your normal soap after using and rinsing off  the CHG Soap.                9.  Pat yourself dry with a clean towel.            10.  Wear clean pajamas.            11.  Place clean sheets on your bed the night of your first shower and do not  sleep with pets. Day of Surgery : Do not apply any lotions/deodorants the morning of surgery.  Please wear clean clothes to the hospital/surgery center.  FAILURE TO FOLLOW THESE INSTRUCTIONS MAY RESULT IN THE CANCELLATION OF YOUR SURGERY PATIENT SIGNATURE_________________________________  NURSE SIGNATURE__________________________________  ________________________________________________________________________

## 2016-08-07 ENCOUNTER — Encounter (HOSPITAL_COMMUNITY): Payer: Self-pay

## 2016-08-07 ENCOUNTER — Encounter (HOSPITAL_COMMUNITY)
Admission: RE | Admit: 2016-08-07 | Discharge: 2016-08-07 | Disposition: A | Discharge status: Home / Self Care | Payer: Medicare Other | Source: Ambulatory Visit | Attending: Urology | Admitting: Urology

## 2016-08-07 ENCOUNTER — Ambulatory Visit (HOSPITAL_COMMUNITY)
Admission: RE | Admit: 2016-08-07 | Discharge: 2016-08-07 | Disposition: A | Discharge status: Home / Self Care | Payer: Medicare Other | Source: Ambulatory Visit | Attending: Urology | Admitting: Urology

## 2016-08-07 DIAGNOSIS — M5136 Other intervertebral disc degeneration, lumbar region: Secondary | ICD-10-CM | POA: Insufficient documentation

## 2016-08-07 DIAGNOSIS — C61 Malignant neoplasm of prostate: Secondary | ICD-10-CM

## 2016-08-07 LAB — CBC
HEMATOCRIT: 41.8 % (ref 39.0–52.0)
HEMOGLOBIN: 14.6 g/dL (ref 13.0–17.0)
MCH: 33.6 pg (ref 26.0–34.0)
MCHC: 34.9 g/dL (ref 30.0–36.0)
MCV: 96.1 fL (ref 78.0–100.0)
Platelets: 228 10*3/uL (ref 150–400)
RBC: 4.35 MIL/uL (ref 4.22–5.81)
RDW: 13.1 % (ref 11.5–15.5)
WBC: 5.7 10*3/uL (ref 4.0–10.5)

## 2016-08-07 LAB — PSA: PSA: 6.59 ng/mL — AB (ref 0.00–4.00)

## 2016-08-07 LAB — BASIC METABOLIC PANEL
Anion gap: 7 (ref 5–15)
BUN: 23 mg/dL — AB (ref 6–20)
CALCIUM: 9.1 mg/dL (ref 8.9–10.3)
CHLORIDE: 106 mmol/L (ref 101–111)
CO2: 25 mmol/L (ref 22–32)
Creatinine, Ser: 0.89 mg/dL (ref 0.61–1.24)
GFR calc Af Amer: >60 mL/min (ref >60)
Glucose, Bld: 89 mg/dL (ref 65–99)
POTASSIUM: 4.2 mmol/L (ref 3.5–5.1)
SODIUM: 138 mmol/L (ref 135–145)

## 2016-08-07 MED ORDER — TECHNETIUM TC 99M MEDRONATE IV KIT
25.0000 | PACK | Freq: Once | INTRAVENOUS | Status: AC | PRN
  Administered 2016-08-07: 25 via INTRAVENOUS

## 2016-08-07 NOTE — Progress Notes (Signed)
08-07-16 PSA result routed to Dr. Louis Meckel for review.

## 2016-08-08 LAB — URINE CULTURE: Culture: NO GROWTH

## 2016-08-09 ENCOUNTER — Encounter (HOSPITAL_COMMUNITY): Payer: Self-pay

## 2016-08-09 ENCOUNTER — Ambulatory Visit (HOSPITAL_COMMUNITY): Payer: Medicare Other | Admitting: Anesthesiology

## 2016-08-09 ENCOUNTER — Observation Stay (HOSPITAL_COMMUNITY)
Admission: RE | Admit: 2016-08-09 | Discharge: 2016-08-10 | Disposition: A | Discharge status: Home / Self Care | Payer: Medicare Other | Source: Ambulatory Visit | Attending: Urology | Admitting: Urology

## 2016-08-09 ENCOUNTER — Encounter (HOSPITAL_COMMUNITY)
Admission: RE | Disposition: A | Discharge status: Home / Self Care | Payer: Self-pay | Source: Ambulatory Visit | Attending: Urology

## 2016-08-09 DIAGNOSIS — E785 Hyperlipidemia, unspecified: Secondary | ICD-10-CM | POA: Diagnosis not present

## 2016-08-09 DIAGNOSIS — C61 Malignant neoplasm of prostate: Secondary | ICD-10-CM | POA: Diagnosis not present

## 2016-08-09 DIAGNOSIS — Z8546 Personal history of malignant neoplasm of prostate: Secondary | ICD-10-CM | POA: Diagnosis present

## 2016-08-09 DIAGNOSIS — Z96659 Presence of unspecified artificial knee joint: Secondary | ICD-10-CM | POA: Insufficient documentation

## 2016-08-09 DIAGNOSIS — Z8673 Personal history of transient ischemic attack (TIA), and cerebral infarction without residual deficits: Secondary | ICD-10-CM | POA: Insufficient documentation

## 2016-08-09 DIAGNOSIS — N529 Male erectile dysfunction, unspecified: Secondary | ICD-10-CM | POA: Diagnosis not present

## 2016-08-09 DIAGNOSIS — I2699 Other pulmonary embolism without acute cor pulmonale: Secondary | ICD-10-CM | POA: Diagnosis not present

## 2016-08-09 HISTORY — PX: ROBOT ASSISTED LAPAROSCOPIC RADICAL PROSTATECTOMY: SHX5141

## 2016-08-09 HISTORY — PX: LYMPHADENECTOMY: SHX5960

## 2016-08-09 LAB — TYPE AND SCREEN
ABO/RH(D): A POS
ANTIBODY SCREEN: NEGATIVE

## 2016-08-09 LAB — HEMOGLOBIN AND HEMATOCRIT, BLOOD
HEMATOCRIT: 37.9 % — AB (ref 39.0–52.0)
Hemoglobin: 13.1 g/dL (ref 13.0–17.0)

## 2016-08-09 SURGERY — PROSTATECTOMY, RADICAL, ROBOT-ASSISTED, LAPAROSCOPIC
Anesthesia: General

## 2016-08-09 MED ORDER — ONDANSETRON HCL 4 MG/2ML IJ SOLN
INTRAMUSCULAR | Status: AC
  Filled 2016-08-09: qty 2

## 2016-08-09 MED ORDER — SCOPOLAMINE 1 MG/3DAYS TD PT72
MEDICATED_PATCH | TRANSDERMAL | Status: AC
  Filled 2016-08-09: qty 1

## 2016-08-09 MED ORDER — DEXTROSE-NACL 5-0.45 % IV SOLN
INTRAVENOUS | Status: DC
  Administered 2016-08-09 – 2016-08-10 (×4): via INTRAVENOUS

## 2016-08-09 MED ORDER — BUPIVACAINE-EPINEPHRINE (PF) 0.25% -1:200000 IJ SOLN
INTRAMUSCULAR | Status: DC | PRN
  Administered 2016-08-09: 8 mL

## 2016-08-09 MED ORDER — TRAMADOL HCL 50 MG PO TABS: 50.0000 mg | ORAL_TABLET | Freq: Four times a day (QID) | ORAL | 0 refills | Status: DC | PRN

## 2016-08-09 MED ORDER — HYDROMORPHONE HCL 1 MG/ML IJ SOLN
0.5000 mg | INTRAMUSCULAR | Status: DC | PRN
  Administered 2016-08-09: 0.5 mg via INTRAVENOUS
  Filled 2016-08-09 (×2): qty 0.5

## 2016-08-09 MED ORDER — SODIUM CHLORIDE 0.9 % IV BOLUS (SEPSIS)
1000.0000 mL | Freq: Once | INTRAVENOUS | Status: AC
  Administered 2016-08-09: 1000 mL via INTRAVENOUS

## 2016-08-09 MED ORDER — MIDAZOLAM HCL 2 MG/2ML IJ SOLN
INTRAMUSCULAR | Status: AC
  Filled 2016-08-09: qty 2

## 2016-08-09 MED ORDER — CEFAZOLIN SODIUM-DEXTROSE 2-4 GM/100ML-% IV SOLN
INTRAVENOUS | Status: AC
  Filled 2016-08-09: qty 100

## 2016-08-09 MED ORDER — PROPOFOL 10 MG/ML IV BOLUS
INTRAVENOUS | Status: AC
  Filled 2016-08-09: qty 40

## 2016-08-09 MED ORDER — DIPHENHYDRAMINE HCL 50 MG/ML IJ SOLN: 12.5000 mg | Freq: Four times a day (QID) | INTRAMUSCULAR | Status: DC | PRN

## 2016-08-09 MED ORDER — BUPIVACAINE HCL (PF) 0.25 % IJ SOLN
INTRAMUSCULAR | Status: AC
  Filled 2016-08-09: qty 30

## 2016-08-09 MED ORDER — SUGAMMADEX SODIUM 200 MG/2ML IV SOLN
INTRAVENOUS | Status: AC
Start: 2016-08-09 — End: 2016-08-09
  Filled 2016-08-09: qty 2

## 2016-08-09 MED ORDER — FENTANYL CITRATE (PF) 100 MCG/2ML IJ SOLN
INTRAMUSCULAR | Status: DC | PRN
  Administered 2016-08-09 (×3): 50 ug via INTRAVENOUS
  Administered 2016-08-09: 100 ug via INTRAVENOUS

## 2016-08-09 MED ORDER — SODIUM CHLORIDE 0.9 % IJ SOLN
INTRAMUSCULAR | Status: DC | PRN
  Administered 2016-08-09: 20 mL

## 2016-08-09 MED ORDER — FENTANYL CITRATE (PF) 100 MCG/2ML IJ SOLN
25.0000 ug | INTRAMUSCULAR | Status: DC | PRN
Start: 2016-08-09 — End: 2016-08-09
  Administered 2016-08-09 (×2): 25 ug via INTRAVENOUS
  Administered 2016-08-09: 50 ug via INTRAVENOUS
  Administered 2016-08-09: 25 ug via INTRAVENOUS

## 2016-08-09 MED ORDER — HYDROMORPHONE HCL 1 MG/ML IJ SOLN
INTRAMUSCULAR | Status: DC | PRN
  Administered 2016-08-09: 1 mg via INTRAVENOUS

## 2016-08-09 MED ORDER — LIDOCAINE 2% (20 MG/ML) 5 ML SYRINGE
INTRAMUSCULAR | Status: DC | PRN
  Administered 2016-08-09: 80 mg via INTRAVENOUS

## 2016-08-09 MED ORDER — HYDROMORPHONE HCL 2 MG/ML IJ SOLN
INTRAMUSCULAR | Status: AC
  Filled 2016-08-09: qty 1

## 2016-08-09 MED ORDER — FENTANYL CITRATE (PF) 100 MCG/2ML IJ SOLN
INTRAMUSCULAR | Status: AC
  Filled 2016-08-09: qty 4

## 2016-08-09 MED ORDER — KETOROLAC TROMETHAMINE 15 MG/ML IJ SOLN
15.0000 mg | Freq: Four times a day (QID) | INTRAMUSCULAR | Status: DC
  Administered 2016-08-09 – 2016-08-10 (×5): 15 mg via INTRAVENOUS
  Filled 2016-08-09 (×4): qty 1

## 2016-08-09 MED ORDER — GLYCOPYRROLATE 0.2 MG/ML IV SOSY
PREFILLED_SYRINGE | INTRAVENOUS | Status: DC | PRN
  Administered 2016-08-09: .3 mg via INTRAVENOUS

## 2016-08-09 MED ORDER — LACTATED RINGERS IV SOLN
INTRAVENOUS | Status: DC | PRN
  Administered 2016-08-09: 06:00:00 via INTRAVENOUS

## 2016-08-09 MED ORDER — LACTATED RINGERS IV SOLN
INTRAVENOUS | Status: DC | PRN
  Administered 2016-08-09 (×2): via INTRAVENOUS

## 2016-08-09 MED ORDER — DIPHENHYDRAMINE HCL 12.5 MG/5ML PO ELIX: 12.5000 mg | ORAL_SOLUTION | Freq: Four times a day (QID) | ORAL | Status: DC | PRN

## 2016-08-09 MED ORDER — DEXAMETHASONE SODIUM PHOSPHATE 10 MG/ML IJ SOLN
INTRAMUSCULAR | Status: AC
  Filled 2016-08-09: qty 1

## 2016-08-09 MED ORDER — GLYCOPYRROLATE 0.2 MG/ML IV SOSY
PREFILLED_SYRINGE | INTRAVENOUS | Status: AC
  Filled 2016-08-09: qty 5

## 2016-08-09 MED ORDER — CEFAZOLIN SODIUM-DEXTROSE 2-4 GM/100ML-% IV SOLN
2.0000 g | INTRAVENOUS | Status: AC
  Administered 2016-08-09 (×2): 2 g via INTRAVENOUS
  Filled 2016-08-09: qty 100

## 2016-08-09 MED ORDER — SODIUM CHLORIDE 0.9 % IJ SOLN
INTRAMUSCULAR | Status: AC
  Filled 2016-08-09: qty 20

## 2016-08-09 MED ORDER — LACTATED RINGERS IR SOLN
Status: DC | PRN
  Administered 2016-08-09: 1000 mL

## 2016-08-09 MED ORDER — ONDANSETRON HCL 4 MG/2ML IJ SOLN
4.0000 mg | INTRAMUSCULAR | Status: DC | PRN
  Filled 2016-08-09: qty 2

## 2016-08-09 MED ORDER — SULFAMETHOXAZOLE-TRIMETHOPRIM 800-160 MG PO TABS: 1.0000 | ORAL_TABLET | Freq: Two times a day (BID) | ORAL | 0 refills | Status: DC

## 2016-08-09 MED ORDER — PROPOFOL 10 MG/ML IV BOLUS
INTRAVENOUS | Status: DC | PRN
Start: 2016-08-09 — End: 2016-08-09
  Administered 2016-08-09: 170 mg via INTRAVENOUS

## 2016-08-09 MED ORDER — ONDANSETRON HCL 4 MG/2ML IJ SOLN
4.0000 mg | Freq: Once | INTRAMUSCULAR | Status: AC | PRN
  Administered 2016-08-09: 4 mg via INTRAVENOUS

## 2016-08-09 MED ORDER — HYDROCODONE-ACETAMINOPHEN 5-325 MG PO TABS: 1.0000 | ORAL_TABLET | ORAL | Status: DC | PRN

## 2016-08-09 MED ORDER — BUPIVACAINE LIPOSOME 1.3 % IJ SUSP
20.0000 mL | Freq: Once | INTRAMUSCULAR | Status: AC
  Administered 2016-08-09: 20 mL
  Filled 2016-08-09: qty 20

## 2016-08-09 MED ORDER — ACETAMINOPHEN 10 MG/ML IV SOLN
1000.0000 mg | Freq: Four times a day (QID) | INTRAVENOUS | Status: AC
  Administered 2016-08-09 – 2016-08-10 (×3): 1000 mg via INTRAVENOUS
  Filled 2016-08-09 (×3): qty 100

## 2016-08-09 MED ORDER — SUCCINYLCHOLINE CHLORIDE 200 MG/10ML IV SOSY
PREFILLED_SYRINGE | INTRAVENOUS | Status: AC
  Filled 2016-08-09: qty 10

## 2016-08-09 MED ORDER — ACETAMINOPHEN 10 MG/ML IV SOLN
1000.0000 mg | Freq: Once | INTRAVENOUS | Status: AC
  Administered 2016-08-09: 1000 mg via INTRAVENOUS

## 2016-08-09 MED ORDER — KETOROLAC TROMETHAMINE 15 MG/ML IJ SOLN
INTRAMUSCULAR | Status: AC
  Filled 2016-08-09: qty 1

## 2016-08-09 MED ORDER — LIDOCAINE 2% (20 MG/ML) 5 ML SYRINGE
INTRAMUSCULAR | Status: AC
  Filled 2016-08-09: qty 5

## 2016-08-09 MED ORDER — FLEET ENEMA 7-19 GM/118ML RE ENEM
1.0000 | ENEMA | Freq: Once | RECTAL | Status: AC
  Administered 2016-08-09: 1 via RECTAL
  Filled 2016-08-09: qty 1

## 2016-08-09 MED ORDER — HYDROCODONE-ACETAMINOPHEN 5-325 MG PO TABS: 1.0000 | ORAL_TABLET | Freq: Four times a day (QID) | ORAL | 0 refills | Status: DC | PRN

## 2016-08-09 MED ORDER — MIDAZOLAM HCL 5 MG/5ML IJ SOLN
INTRAMUSCULAR | Status: DC | PRN
  Administered 2016-08-09: 2 mg via INTRAVENOUS

## 2016-08-09 MED ORDER — FENTANYL CITRATE (PF) 250 MCG/5ML IJ SOLN
INTRAMUSCULAR | Status: AC
  Filled 2016-08-09: qty 5

## 2016-08-09 MED ORDER — SUGAMMADEX SODIUM 200 MG/2ML IV SOLN
INTRAVENOUS | Status: DC | PRN
  Administered 2016-08-09: 200 mg via INTRAVENOUS

## 2016-08-09 MED ORDER — ACETAMINOPHEN 10 MG/ML IV SOLN
INTRAVENOUS | Status: AC
  Filled 2016-08-09: qty 100

## 2016-08-09 MED ORDER — ROCURONIUM BROMIDE 50 MG/5ML IV SOSY
PREFILLED_SYRINGE | INTRAVENOUS | Status: AC
  Filled 2016-08-09: qty 5

## 2016-08-09 MED ORDER — ONDANSETRON HCL 4 MG/2ML IJ SOLN
INTRAMUSCULAR | Status: DC | PRN
  Administered 2016-08-09: 4 mg via INTRAVENOUS

## 2016-08-09 MED ORDER — DEXAMETHASONE SODIUM PHOSPHATE 10 MG/ML IJ SOLN
INTRAMUSCULAR | Status: DC | PRN
  Administered 2016-08-09: 10 mg via INTRAVENOUS

## 2016-08-09 MED ORDER — ROCURONIUM BROMIDE 10 MG/ML (PF) SYRINGE
PREFILLED_SYRINGE | INTRAVENOUS | Status: DC | PRN
  Administered 2016-08-09 (×2): 10 mg via INTRAVENOUS
  Administered 2016-08-09 (×2): 20 mg via INTRAVENOUS
  Administered 2016-08-09: 10 mg via INTRAVENOUS
  Administered 2016-08-09: 50 mg via INTRAVENOUS
  Administered 2016-08-09 (×2): 10 mg via INTRAVENOUS
  Administered 2016-08-09: 20 mg via INTRAVENOUS
  Administered 2016-08-09: 10 mg via INTRAVENOUS

## 2016-08-09 SURGICAL SUPPLY — 57 items
ADH SKN CLS APL DERMABOND .7 (GAUZE/BANDAGES/DRESSINGS) ×2
CATH FOLEY 2WAY SLVR 18FR 30CC (CATHETERS) ×4 IMPLANT
CATH SILICON 18FR 30CC (CATHETERS) ×2 IMPLANT
CATH SILICONE 5CC 18FR (INSTRUMENTS) ×2 IMPLANT
CATH TIEMANN FOLEY 18FR 5CC (CATHETERS) ×4 IMPLANT
CHLORAPREP W/TINT 26ML (MISCELLANEOUS) ×4 IMPLANT
CLIP LIGATING HEM O LOK PURPLE (MISCELLANEOUS) ×18 IMPLANT
COVER SURGICAL LIGHT HANDLE (MISCELLANEOUS) ×4 IMPLANT
COVER TIP SHEARS 8 DVNC (MISCELLANEOUS) ×2 IMPLANT
COVER TIP SHEARS 8MM DA VINCI (MISCELLANEOUS) ×2
CUTTER ECHEON FLEX ENDO 45 340 (ENDOMECHANICALS) ×4 IMPLANT
DECANTER SPIKE VIAL GLASS SM (MISCELLANEOUS) ×4 IMPLANT
DERMABOND ADVANCED (GAUZE/BANDAGES/DRESSINGS) ×2
DERMABOND ADVANCED .7 DNX12 (GAUZE/BANDAGES/DRESSINGS) ×2 IMPLANT
DRAPE ARM DVNC X/XI (DISPOSABLE) ×8 IMPLANT
DRAPE COLUMN DVNC XI (DISPOSABLE) ×2 IMPLANT
DRAPE DA VINCI XI ARM (DISPOSABLE) ×8
DRAPE DA VINCI XI COLUMN (DISPOSABLE) ×2
DRAPE SURG IRRIG POUCH 19X23 (DRAPES) ×4 IMPLANT
DRSG TEGADERM 4X4.75 (GAUZE/BANDAGES/DRESSINGS) ×4 IMPLANT
ELECT REM PT RETURN 15FT ADLT (MISCELLANEOUS) ×4 IMPLANT
GAUZE SPONGE 2X2 8PLY STRL LF (GAUZE/BANDAGES/DRESSINGS) IMPLANT
GLOVE BIO SURGEON STRL SZ 6.5 (GLOVE) ×2 IMPLANT
GLOVE BIO SURGEONS STRL SZ 6.5 (GLOVE)
GLOVE BIOGEL M STRL SZ7.5 (GLOVE) ×4 IMPLANT
GOWN STRL REUS W/TWL LRG LVL3 (GOWN DISPOSABLE) ×6 IMPLANT
GOWN STRL REUS W/TWL XL LVL3 (GOWN DISPOSABLE) ×8 IMPLANT
HEMOSTAT SURGICEL 4X8 (HEMOSTASIS) ×2 IMPLANT
HOLDER FOLEY CATH W/STRAP (MISCELLANEOUS) ×4 IMPLANT
IRRIG SUCT STRYKERFLOW 2 WTIP (MISCELLANEOUS) ×4
IRRIGATION SUCT STRKRFLW 2 WTP (MISCELLANEOUS) ×2 IMPLANT
IV LACTATED RINGERS 1000ML (IV SOLUTION) ×2 IMPLANT
PACK ROBOT UROLOGY CUSTOM (CUSTOM PROCEDURE TRAY) ×4 IMPLANT
PAD POSITIONING PINK XL (MISCELLANEOUS) ×2 IMPLANT
PORT ACCESS TROCAR AIRSEAL 12 (TROCAR) IMPLANT
PORT ACCESS TROCAR AIRSEAL 5M (TROCAR) ×2
RELOAD STAPLE 45 4.1 GRN THCK (STAPLE) ×2 IMPLANT
SCISSORS LAP 5X45 EPIX DISP (ENDOMECHANICALS) ×2 IMPLANT
SEAL CANN UNIV 5-8 DVNC XI (MISCELLANEOUS) ×8 IMPLANT
SEAL XI 5MM-8MM UNIVERSAL (MISCELLANEOUS) ×8
SET TRI-LUMEN FLTR TB AIRSEAL (TUBING) ×2 IMPLANT
SOLUTION ELECTROLUBE (MISCELLANEOUS) ×4 IMPLANT
SPONGE GAUZE 2X2 STER 10/PKG (GAUZE/BANDAGES/DRESSINGS)
SPONGE LAP 4X18 X RAY DECT (DISPOSABLE) ×2 IMPLANT
STAPLE RELOAD 45 GRN (STAPLE) ×2 IMPLANT
STAPLE RELOAD 45MM GREEN (STAPLE) ×4
SUT ETHILON 3 0 PS 1 (SUTURE) ×4 IMPLANT
SUT MNCRL AB 4-0 PS2 18 (SUTURE) ×8 IMPLANT
SUT VIC AB 0 CT1 27 (SUTURE) ×4
SUT VIC AB 0 CT1 27XBRD ANTBC (SUTURE) ×2 IMPLANT
SUT VIC AB 2-0 SH 27 (SUTURE) ×4
SUT VIC AB 2-0 SH 27X BRD (SUTURE) ×2 IMPLANT
SUT VICRYL 0 UR6 27IN ABS (SUTURE) ×8 IMPLANT
SUT VLOC BARB 180 ABS3/0GR12 (SUTURE) ×12
SUTURE VLOC BRB 180 ABS3/0GR12 (SUTURE) ×4 IMPLANT
TOWEL OR NON WOVEN STRL DISP B (DISPOSABLE) ×4 IMPLANT
WATER STERILE IRR 1500ML POUR (IV SOLUTION) ×2 IMPLANT

## 2016-08-09 NOTE — Progress Notes (Signed)
Assumed care of patient at . Agree with previous Nurse assessment.  Surafel Hilleary M. Kaleb Linquist, RN  

## 2016-08-09 NOTE — Op Note (Signed)
Preoperative diagnosis:  1. Prostate Cancer   Postoperative diagnosis:  1. same   Procedure: 1. Robotic assisted laparoscopic radical prostatectomy - left nerve spare 2. Bilateral pelvic lymph node dissection  Surgeon: Ardis Hughs, MD First Assistant: Debbrah Alar, PA  Anesthesia: General  Complications: None  Intraoperative findings:  #1: The patient underwent a left side nerve spare radical prostatectomy with extended bilateral pelvic lymph node dissection.  There was a tight urethrovesical anastomosis.  The procedure was routine.  EBL: 300cc  Specimens:  #1.  Prostate and seminal vesicals #2.  Bilateral pelvic lymph nodes  Indication: Isaac Powers is a 76 y.o. patient with prostate cancer.  After reviewing the management options for treatment, he elected to proceed with the removal of his prostate. We have discussed the potential benefits and risks of the procedure, side effects of the proposed treatment, the likelihood of the patient achieving the goals of the procedure, and any potential problems that might occur during the procedure or recuperation. Informed consent has been obtained.  Description of procedure:  The patient was consented in the preoperative holding area. He was in brought back to the operating room placed the table in supine position. General anesthesia was then induced and endotracheal tube was inserted. He was then placed in dorsolithotomy position and placed in steep Trendelenburg. He was then prepped and draped in the routine sterile fashion. We, the first assistant and I, then began by making a 10 mm incision supraumbilical midline incision the skin down through into the peritoneum. Then placed a 8 mm trocar. I then inflated the abdomen and inserted the 0 robotic lens. We then placed 2 additional a 8 millimeter trochars in the patient's left lower abdomen proximally 9 cm apart and 2 trochars on the patient's right lower abdomen, one was an 8 mm  trocar and the one most lateral was a 12 mm trocar which was used as the assistant port. A 5 mm trocar was placed by triangulating the 2 right lateral ports as a second assistant port. These ports were all placed under visual guidance. Once the ports were noted to be satisfactory position the robot was docked. We started with the 0 lens, monopolar scissors in the right hand and the [place] forceps the left hand as well as a fenestrated grasper as the third arm on the left-hand side.   We, the first assistant and I,  began our dissection the posterior plane incising the peritoneum at the level of the vas deferens. Isolated the left vas deferens and dissected it proximally towards the spermatic cord for 5 cm prior to ligating it. Then used this as traction to isolate the left the seminal vesicle which was then undressed bluntly and completely dissected out, all vessels were cauterized with a combination of bipolar and the monopolar scissors. We then turned our attention to the right side and similarly dissected out the right vas deferens and seminal vesicle. Once the SVs had been freed, we turned our attention to the posterior plane and bluntly dissected the tissue between the rectum and the posterior wall of the prostate bluntly out towards the apex.    At this point the bladder was taken down starting at the urachal remnant with a combination of both blunt dissection and sharp dissection using monopolar cautery the bladder was dropped down in the usual fashion to the medial umbilical ligaments laterally and the dorsal vein of the prostate anteriorly creating our space of Retzius. We then turned our attention to the  endopelvic fascia which was incised laterally starting on the patient's right-hand side the levator muscles were pushed off the prostate laterally up towards the dorsal vein complex on the right-hand side. This process was then repeated on the left-hand side and a nice notch was created for the  dorsal vein. I then used a 32mm stapler to staple the dorsal vein.   We,the first assistant and I, then located the bladder neck at the vesicoprostatic junction and using the monopolar scissors dissected down through the perivesical tissues and the bladder neck down to the prostatic urethra. The catheter was then deflated and pulled through our urethral opening and then used to retract the prostate anteriorly for the posterior bladder neck dissection. Once through the bladder neck and into the posterior plane of the prostate, the SVs were brought through the opening. The left pedicle was then isolated and systematically ligated with Weck clips and scissors. The nerve bundle was then peeled off the posterior lateral aspect of the prostate and bluntly dissected away off the prostate. The pedicle was taken wide on the right and no nerve spare performed..    I then came down through the dorsal venous complex anteriorly down to the membranous urethra using the monopolar. Once down to the urethra, it was transected sharply and the apex of the prostate was then dissected off the levator and rectourethralis muscles. Once the apex of the prostate had been dissected free we came back to the base of the prostate and bluntly push the rectum and nerve vascular bundle off the prostate the patient's left and used clips on the patient's right to free the prostate. Once the prostate was free it was placed off to the side. The pelvis was then irrigated with normal saline and noted to be relatively hemostatic.  Attention was then turned to the right pelvic sidewall. The fibrofatty tissue between the external iliac vein, confluence of the iliac vessels, hypogastric artery, and Cooper's ligament was dissected free from the pelvic sidewall with care to preserve the obturator nerve. Weck clips were used for lymphostasis and hemostasis. An identical procedure was performed on the contralateral side and the lymphatic packets were  removed for permanent pathologic analysis.  The prostate and both lymph node tissues were placed in the Endo Catch bag and the string brought to the 5 mm port.    The vesicourethral anastomosis was then completed with 2 interlocking 3-0 V. lock sutures running the anastomosis in the 6:00 position to the 12:00 position on each side and then tying it off on the top. The final catheter was then passed through the patient's urethra and into the bladder and 120 cc was instilled into the bladder to test the anastomosis. As there was no leak a 30 [place] Blake drain was passed through the left lateral port and placed around the vesicourethral anastomosis. A 12 mm assistant port on the right lateral side was then closed with 0 Vicryl with the help of the [employer] needle. The 12 mm midline infraumbilical incision was then extended another centimeter taken down and the fascia opened to remove the Endo Catch bag with the prostate specimen. The fascia was then closed with a 0 Vicryl and all skin ports were closed with 4-0 Monocryl in a subcutaneous fashion. Dermabond glue was then applied to the incisions. The drain was then secured to the skin with a 0 nylon stitch and dressing applied.   At the end of the case all laps needles and sponges had been  accounted for. There no immediate complications. The patient returned to the PACU in stable condition.
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
## 2016-08-09 NOTE — Transfer of Care (Signed)
Immediate Anesthesia Transfer of Care Note  Patient: Isaac Powers  Procedure(s) Performed: Procedure(s): XI ROBOTIC ASSISTED LAPAROSCOPIC RADICAL PROSTATECTOMY (N/A) BILATERAL PELVIC LYMPH NODE DISSECTION (Bilateral)  Patient Location: PACU  Anesthesia Type:General  Level of Consciousness:  sedated, patient cooperative and responds to stimulation  Airway & Oxygen Therapy:Patient Spontanous Breathing and Patient connected to face mask oxgen  Post-op Assessment:  Report given to PACU RN and Post -op Vital signs reviewed and stable  Post vital signs:  Reviewed and stable  Last Vitals:  Vitals:   08/09/16 0508  BP: (!) 157/90  Pulse: 60  Resp: 18  Temp: 85.4 C    Complications: No apparent anesthesia complications

## 2016-08-09 NOTE — Discharge Instructions (Signed)

## 2016-08-09 NOTE — Anesthesia Postprocedure Evaluation (Signed)
Anesthesia Post Note  Patient: Isaac Powers  Procedure(s) Performed: Procedure(s) (LRB): XI ROBOTIC ASSISTED LAPAROSCOPIC RADICAL PROSTATECTOMY (N/A) BILATERAL PELVIC LYMPH NODE DISSECTION (Bilateral)  Patient location during evaluation: Other Anesthesia Type: General Level of consciousness: awake and alert Pain management: pain level controlled Vital Signs Assessment: post-procedure vital signs reviewed and stable Respiratory status: spontaneous breathing, nonlabored ventilation, respiratory function stable and patient connected to nasal cannula oxygen Cardiovascular status: blood pressure returned to baseline and stable Postop Assessment: no signs of nausea or vomiting Anesthetic complications: no       Last Vitals:  Vitals:   08/09/16 1404 08/09/16 1721  BP: 134/74 139/87  Pulse: (!) 57 62  Resp: 12 16  Temp: 36.4 C 36.4 C    Last Pain:  Vitals:   08/09/16 1721  TempSrc: Oral  PainSc:                  Catalina Gravel

## 2016-08-09 NOTE — Anesthesia Preprocedure Evaluation (Addendum)
Anesthesia Evaluation  Patient identified by MRN, date of birth, ID band Patient awake    Reviewed: Allergy & Precautions, NPO status , Patient's Chart, lab work & pertinent test results  Airway Mallampati: II  TM Distance: >3 FB Neck ROM: Full    Dental  (+) Teeth Intact, Dental Advisory Given, Caps,    Pulmonary PE   Pulmonary exam normal breath sounds clear to auscultation       Cardiovascular + Peripheral Vascular Disease  Normal cardiovascular exam Rhythm:Regular Rate:Normal     Neuro/Psych CVA, No Residual Symptoms negative psych ROS   GI/Hepatic negative GI ROS, Neg liver ROS,   Endo/Other  negative endocrine ROSObesity   Renal/GU negative Renal ROS   Prostate cancer    Musculoskeletal  (+) Arthritis , Osteoarthritis,    Abdominal   Peds  Hematology negative hematology ROS (+)   Anesthesia Other Findings Day of surgery medications reviewed with the patient.  Reproductive/Obstetrics                            Anesthesia Physical Anesthesia Plan  ASA: II  Anesthesia Plan: General   Post-op Pain Management:    Induction: Intravenous  Airway Management Planned: Oral ETT  Additional Equipment:   Intra-op Plan:   Post-operative Plan: Extubation in OR  Informed Consent: I have reviewed the patients History and Physical, chart, labs and discussed the procedure including the risks, benefits and alternatives for the proposed anesthesia with the patient or authorized representative who has indicated his/her understanding and acceptance.   Dental advisory given  Plan Discussed with: CRNA  Anesthesia Plan Comments: (2nd IV after induction)        Anesthesia Quick Evaluation

## 2016-08-09 NOTE — Progress Notes (Signed)
POD#0 s/p RALP  Already up and walking Complaining of abdominal pressure w/ minimal pain.  AFVSS  Intake/Output Summary (Last 24 hours) at 08/09/16 1926 Last data filed at 08/09/16 1634  Gross per 24 hour  Intake          3743.33 ml  Output              550 ml  Net          3193.33 ml   Mimial drain output  Incisions are dry. Labs are stable.  A/P: doing well s/p prostatectomy.  Routine care.  Drain out in AM.  Anticipate home tomorrow.

## 2016-08-09 NOTE — Anesthesia Procedure Notes (Signed)
Procedure Name: Intubation Date/Time: 08/09/2016 7:54 AM Performed by: Juventino Pavone, Virgel Gess Pre-anesthesia Checklist: Patient identified, Emergency Drugs available, Suction available, Patient being monitored and Timeout performed Patient Re-evaluated:Patient Re-evaluated prior to inductionOxygen Delivery Method: Circle system utilized Preoxygenation: Pre-oxygenation with 100% oxygen Intubation Type: IV induction Ventilation: Mask ventilation without difficulty Laryngoscope Size: Mac and 4 Grade View: Grade II Tube type: Oral Tube size: 7.5 mm Number of attempts: 1 Airway Equipment and Method: Stylet Placement Confirmation: ETT inserted through vocal cords under direct vision,  positive ETCO2,  CO2 detector and breath sounds checked- equal and bilateral Secured at: 23 cm Tube secured with: Tape Dental Injury: Teeth and Oropharynx as per pre-operative assessment

## 2016-08-10 DIAGNOSIS — Z96659 Presence of unspecified artificial knee joint: Secondary | ICD-10-CM | POA: Diagnosis not present

## 2016-08-10 DIAGNOSIS — Z8673 Personal history of transient ischemic attack (TIA), and cerebral infarction without residual deficits: Secondary | ICD-10-CM | POA: Diagnosis not present

## 2016-08-10 DIAGNOSIS — C61 Malignant neoplasm of prostate: Secondary | ICD-10-CM | POA: Diagnosis not present

## 2016-08-10 LAB — BASIC METABOLIC PANEL
Anion gap: 7 (ref 5–15)
BUN: 13 mg/dL (ref 6–20)
CHLORIDE: 104 mmol/L (ref 101–111)
CO2: 23 mmol/L (ref 22–32)
Calcium: 8 mg/dL — ABNORMAL LOW (ref 8.9–10.3)
Creatinine, Ser: 0.96 mg/dL (ref 0.61–1.24)
GFR calc non Af Amer: >60 mL/min (ref >60)
Glucose, Bld: 135 mg/dL — ABNORMAL HIGH (ref 65–99)
POTASSIUM: 4.4 mmol/L (ref 3.5–5.1)
SODIUM: 134 mmol/L — AB (ref 135–145)

## 2016-08-10 LAB — HEMOGLOBIN AND HEMATOCRIT, BLOOD
HEMATOCRIT: 33.2 % — AB (ref 39.0–52.0)
HEMOGLOBIN: 11.2 g/dL — AB (ref 13.0–17.0)

## 2016-08-10 MED ORDER — SENNOSIDES-DOCUSATE SODIUM 8.6-50 MG PO TABS: 1.0000 | ORAL_TABLET | Freq: Two times a day (BID) | ORAL | 0 refills | Status: DC

## 2016-08-10 NOTE — Discharge Summary (Signed)
Physician Discharge Summary  Patient ID: Isaac Powers MRN: [id number] DOB/AGE: 1941-02-12 76 y.o.  Admit date: 08/09/2016 Discharge date: 08/10/2016  Admission Diagnoses: Prostate Cancer  Discharge Diagnoses:  Active Problems:   Prostate cancer [facility])   Discharged Condition: good  Hospital Course: Pt underwent robotic prostatectomy with bilateral pelvic lymphadenectomy on 08/09/16, the day of admission, without acute complication. He was admitted overnight for observation. On AM of POD 1 his JP was removed as output scant. By the afternoon of POD 1 he is ambulatory, pain controlled on PO meds, maintining hydration, and felt to be adequate for discharge. Hgb 13.1, Surgical path pending at discharge.   Consults: None  Significant Diagnostic Studies: labs: as per above  Treatments: surgery: as per above  Discharge Exam: Blood pressure 114/69, pulse 76, temperature 97.8 F (36.6 C), temperature source Oral, resp. rate 18, height 5\' 9"  (1.753 m), weight 94.8 kg (209 lb), SpO2 97 %. General appearance: alert, cooperative, appears stated age and wife at bedside.  Eyes: negative Nose: Nares normal. Septum midline. Mucosa normal. No drainage or sinus tenderness. Throat: lips, mucosa, and tongue normal; teeth and gums normal Neck: supple, symmetrical, trachea midline Back: symmetric, no curvature. ROM normal. No CVA tenderness. Resp: non-labored on room air.  Cardio: Nl rate GI: soft, non-tender; bowel sounds normal; no masses,  no organomegaly Male genitalia: normal, catheter in place wtih medium yellow urine.  Extremities: extremities normal, atraumatic, no cyanosis or edema Pulses: 2+ and symmetric Skin: Skin color, texture, turgor normal. No rashes or lesions Lymph nodes: Cervical, supraclavicular, and axillary nodes normal. Neurologic: Grossly normal Incision/Wound: recent port and extraction sties c/d/i.   Disposition: 06-Home-Health Care Svc   Allergies as of 08/10/2016    Reactions   Latex    REACTION: Skin irritation      Medication List    STOP taking these medications   Fish Oil 1000 MG Caps   ibuprofen 200 MG tablet Commonly known as:  ADVIL,MOTRIN   multivitamin tablet   OVER THE COUNTER MEDICATION   sildenafil 20 MG tablet Commonly known as:  REVATIO     TAKE these medications   senna-docusate 8.6-50 MG tablet Commonly known as:  Senokot-S Take 1 tablet by mouth 2 (two) times daily. While taking pain meds to prevent constipation.   sulfamethoxazole-trimethoprim 800-160 MG tablet Commonly known as:  BACTRIM DS,SEPTRA DS Take 1 tablet by mouth 2 (two) times daily. Start the day prior to foley removal appointment   traMADol 50 MG tablet Commonly known as:  ULTRAM Take 1-2 tablets (50-100 mg total) by mouth every 6 (six) hours as needed for moderate pain or severe pain.   UNABLE TO FIND Apply 1 application topically every other day. Daivobet cream  For psoriasis      Follow-up Information    Ardis Hughs, MD On 08/16/2016.   Specialty:  Urology Why:  at 2:45 Contact information: [place] [address] [phone]           Signed: Alexis Frock 08/10/2016, 2:04 PM

## 2016-08-10 NOTE — Progress Notes (Signed)
D/c to home w/ wife via w/c.D/c instructions given including leg/hs drain bag and f/c care,w/ verbal understanding.In good spirits. voices no c/o.JP site on Lt w/ smalll amt drng.give 2x2 gauze to place over later today.

## 2016-08-12 ENCOUNTER — Encounter (HOSPITAL_COMMUNITY): Payer: Self-pay | Admitting: Urology

## 2016-08-27 DIAGNOSIS — M6281 Muscle weakness (generalized): Secondary | ICD-10-CM | POA: Diagnosis not present

## 2016-08-27 DIAGNOSIS — M62838 Other muscle spasm: Secondary | ICD-10-CM | POA: Diagnosis not present

## 2016-08-27 DIAGNOSIS — R102 Pelvic and perineal pain: Secondary | ICD-10-CM | POA: Diagnosis not present

## 2016-08-27 DIAGNOSIS — N3946 Mixed incontinence: Secondary | ICD-10-CM | POA: Diagnosis not present

## 2016-09-13 DIAGNOSIS — L905 Scar conditions and fibrosis of skin: Secondary | ICD-10-CM | POA: Diagnosis not present

## 2016-09-13 DIAGNOSIS — M62838 Other muscle spasm: Secondary | ICD-10-CM | POA: Diagnosis not present

## 2016-09-13 DIAGNOSIS — N3946 Mixed incontinence: Secondary | ICD-10-CM | POA: Diagnosis not present

## 2016-09-13 DIAGNOSIS — Z8546 Personal history of malignant neoplasm of prostate: Secondary | ICD-10-CM | POA: Diagnosis not present

## 2016-09-13 DIAGNOSIS — M6281 Muscle weakness (generalized): Secondary | ICD-10-CM | POA: Diagnosis not present

## 2016-09-18 DIAGNOSIS — M6281 Muscle weakness (generalized): Secondary | ICD-10-CM | POA: Diagnosis not present

## 2016-09-18 DIAGNOSIS — N3946 Mixed incontinence: Secondary | ICD-10-CM | POA: Diagnosis not present

## 2016-09-18 DIAGNOSIS — M62838 Other muscle spasm: Secondary | ICD-10-CM | POA: Diagnosis not present

## 2016-09-18 DIAGNOSIS — R102 Pelvic and perineal pain: Secondary | ICD-10-CM | POA: Diagnosis not present

## 2016-09-24 DIAGNOSIS — C61 Malignant neoplasm of prostate: Secondary | ICD-10-CM | POA: Diagnosis not present

## 2016-09-26 DIAGNOSIS — N3946 Mixed incontinence: Secondary | ICD-10-CM | POA: Diagnosis not present

## 2016-09-26 DIAGNOSIS — M62838 Other muscle spasm: Secondary | ICD-10-CM | POA: Diagnosis not present

## 2016-09-26 DIAGNOSIS — M6281 Muscle weakness (generalized): Secondary | ICD-10-CM | POA: Diagnosis not present

## 2016-10-04 ENCOUNTER — Ambulatory Visit: Payer: Medicare Other | Admitting: Internal Medicine

## 2016-10-08 DIAGNOSIS — M62838 Other muscle spasm: Secondary | ICD-10-CM | POA: Diagnosis not present

## 2016-10-08 DIAGNOSIS — R102 Pelvic and perineal pain: Secondary | ICD-10-CM | POA: Diagnosis not present

## 2016-10-08 DIAGNOSIS — M6281 Muscle weakness (generalized): Secondary | ICD-10-CM | POA: Diagnosis not present

## 2016-10-08 DIAGNOSIS — N3946 Mixed incontinence: Secondary | ICD-10-CM | POA: Diagnosis not present

## 2016-10-16 ENCOUNTER — Inpatient Hospital Stay: Admit: 2016-10-16 | Payer: Medicare Other | Admitting: Orthopedic Surgery

## 2016-10-16 SURGERY — ARTHROPLASTY, HIP, TOTAL,POSTERIOR APPROACH
Anesthesia: Choice | Site: Hip | Laterality: Right

## 2016-10-23 DIAGNOSIS — M62838 Other muscle spasm: Secondary | ICD-10-CM | POA: Diagnosis not present

## 2016-10-23 DIAGNOSIS — M6281 Muscle weakness (generalized): Secondary | ICD-10-CM | POA: Diagnosis not present

## 2016-10-23 DIAGNOSIS — N393 Stress incontinence (female) (male): Secondary | ICD-10-CM | POA: Diagnosis not present

## 2016-10-23 DIAGNOSIS — N3946 Mixed incontinence: Secondary | ICD-10-CM | POA: Diagnosis not present

## 2016-11-04 DIAGNOSIS — M6281 Muscle weakness (generalized): Secondary | ICD-10-CM | POA: Diagnosis not present

## 2016-11-04 DIAGNOSIS — M62838 Other muscle spasm: Secondary | ICD-10-CM | POA: Diagnosis not present

## 2016-11-04 DIAGNOSIS — M6200 Separation of muscle (nontraumatic), unspecified site: Secondary | ICD-10-CM | POA: Diagnosis not present

## 2016-11-04 DIAGNOSIS — N3946 Mixed incontinence: Secondary | ICD-10-CM | POA: Diagnosis not present

## 2016-12-23 DIAGNOSIS — Z8546 Personal history of malignant neoplasm of prostate: Secondary | ICD-10-CM | POA: Diagnosis not present

## 2016-12-26 ENCOUNTER — Encounter: Payer: Self-pay | Admitting: Internal Medicine

## 2016-12-26 ENCOUNTER — Ambulatory Visit (INDEPENDENT_AMBULATORY_CARE_PROVIDER_SITE_OTHER): Payer: Medicare Other | Admitting: Internal Medicine

## 2016-12-26 VITALS — BP 138/68 | HR 64 | Temp 97.7°F | Ht 69.0 in | Wt 207.0 lb

## 2016-12-26 DIAGNOSIS — Z01818 Encounter for other preprocedural examination: Secondary | ICD-10-CM

## 2016-12-26 DIAGNOSIS — Z23 Encounter for immunization: Secondary | ICD-10-CM

## 2016-12-26 NOTE — Addendum Note (Signed)
Addended by: Verlene Mayer A on: 12/26/2016 04:44 PM   Modules accepted: Orders
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
## 2016-12-26 NOTE — Assessment & Plan Note (Addendum)
Doing well No symptoms or concerning PE findings No history of DVT EKG-- sinus, normal rate, non specific IVCD with RBBB pattern but no ischemic changes No change from 05/12/14  He is medically cleared Should have routine post op DVT prophylaxis

## 2016-12-27 ENCOUNTER — Ambulatory Visit: Payer: Self-pay | Admitting: Orthopedic Surgery

## 2016-12-27 NOTE — Progress Notes (Signed)
Please place orders in EPIC as patient is being scheduled for a pre-op appointment! Thank you! 

## 2016-12-30 DIAGNOSIS — N393 Stress incontinence (female) (male): Secondary | ICD-10-CM | POA: Diagnosis not present

## 2016-12-30 DIAGNOSIS — Z8546 Personal history of malignant neoplasm of prostate: Secondary | ICD-10-CM | POA: Diagnosis not present

## 2016-12-30 DIAGNOSIS — N5231 Erectile dysfunction following radical prostatectomy: Secondary | ICD-10-CM | POA: Diagnosis not present

## 2017-01-03 NOTE — Progress Notes (Signed)
Clearance Dr. Silvio Pate 12/26/16   ekg 12/26/16   ovn Dr. Kara Pacer 12/26/16  ALL on chart

## 2017-01-03 NOTE — Patient Instructions (Signed)
Isaac Powers  01/03/2017   Your procedure is scheduled on: 01/15/17  Report to [facility] Main  Entrance   Report to admitting at    11:45AM   Call this number if you have problems the morning of surgery  [phone]   Remember: ONLY 1 PERSON MAY GO WITH YOU TO SHORT STAY TO GET  READY MORNING OF YOUR SURGERY.  Do not eat food :After Midnight.YOU MAY HAVE CLEAR LIQUIDS UNTIL 0815 AM THEN NOTHING BY MOUTH     Take these medicines the morning of surgery with A SIP OF WATER: NONE                                You may not have any metal on your body including hair pins and              piercings  Do not wear jewelry,lotions, powders or perfumes, deodorant                        Men may shave face and neck.   Do not bring valuables to the hospital. [place].  Contacts, dentures or bridgework may not be worn into surgery.  Leave suitcase in the car. After surgery it may be brought to your room.               Please read over the following fact sheets you were given: _____________________________________________________________________           [facility] - Preparing for Surgery Before surgery, you can play an important role.  Because skin is not sterile, your skin needs to be as free of germs as possible.  You can reduce the number of germs on your skin by washing with CHG (chlorahexidine gluconate) soap before surgery.  CHG is an antiseptic cleaner which kills germs and bonds with the skin to continue killing germs even after washing. Please DO NOT use if you have an allergy to CHG or antibacterial soaps.  If your skin becomes reddened/irritated stop using the CHG and inform your nurse when you arrive at Short Stay. Do not shave (including legs and underarms) for at least 48 hours prior to the first CHG shower.  You may shave your face/neck. Please follow these instructions carefully:  1.  Shower with CHG  Soap the night before surgery and the  morning of Surgery.  2.  If you choose to wash your hair, wash your hair first as usual with your  normal  shampoo.  3.  After you shampoo, rinse your hair and body thoroughly to remove the  shampoo.                           4.  Use CHG as you would any other liquid soap.  You can apply chg directly  to the skin and wash                       Gently with a scrungie or clean washcloth.  5.  Apply the CHG Soap to your body ONLY FROM THE NECK DOWN.   Do not use on face/ open  Wound or open sores. Avoid contact with eyes, ears mouth and genitals (private parts).                       Wash face,  Genitals (private parts) with your normal soap.             6.  Wash thoroughly, paying special attention to the area where your surgery  will be performed.  7.  Thoroughly rinse your body with warm water from the neck down.  8.  DO NOT shower/wash with your normal soap after using and rinsing off  the CHG Soap.                9.  Pat yourself dry with a clean towel.            10.  Wear clean pajamas.            11.  Place clean sheets on your bed the night of your first shower and do not  sleep with pets. Day of Surgery : Do not apply any lotions/deodorants the morning of surgery.  Please wear clean clothes to the hospital/surgery center.  FAILURE TO FOLLOW THESE INSTRUCTIONS MAY RESULT IN THE CANCELLATION OF YOUR SURGERY PATIENT SIGNATURE_________________________________  NURSE SIGNATURE__________________________________  ________________________________________________________________________    CLEAR LIQUID DIET   Foods Allowed                                                                     Foods Excluded  Coffee and tea, regular and decaf                             liquids that you cannot  Plain Jell-O in any flavor                                             see through such as: Fruit ices (not with fruit pulp)                                      milk, soups, orange juice  Iced Popsicles                                    All solid food Carbonated beverages, regular and diet                                    Cranberry, grape and apple juices Sports drinks like Gatorade Lightly seasoned clear broth or consume(fat free) Sugar, honey syrup  Sample Menu Breakfast                                Lunch  Supper Cranberry juice                    Beef broth                            Chicken broth Jell-O                                     Grape juice                           Apple juice Coffee or tea                        Jell-O                                      Popsicle                                                Coffee or tea                        Coffee or tea  _____________________________________________________________________   WHAT IS A BLOOD TRANSFUSION? Blood Transfusion Information  A transfusion is the replacement of blood or some of its parts. Blood is made up of multiple cells which provide different functions.  Red blood cells carry oxygen and are used for blood loss replacement.  White blood cells fight against infection.  Platelets control bleeding.  Plasma helps clot blood.  Other blood products are available for specialized needs, such as hemophilia or other clotting disorders. BEFORE THE TRANSFUSION  Who gives blood for transfusions?   Healthy volunteers who are fully evaluated to make sure their blood is safe. This is blood bank blood. Transfusion therapy is the safest it has ever been in the practice of medicine. Before blood is taken from a donor, a complete history is taken to make sure that person has no history of diseases nor engages in risky social behavior (examples are intravenous drug use or sexual activity with multiple partners). The donor's travel history is screened to minimize risk of transmitting infections, such as malaria.  The donated blood is tested for signs of infectious diseases, such as HIV and hepatitis. The blood is then tested to be sure it is compatible with you in order to minimize the chance of a transfusion reaction. If you or a relative donates blood, this is often done in anticipation of surgery and is not appropriate for emergency situations. It takes many days to process the donated blood. RISKS AND COMPLICATIONS Although transfusion therapy is very safe and saves many lives, the main dangers of transfusion include:   Getting an infectious disease.  Developing a transfusion reaction. This is an allergic reaction to something in the blood you were given. Every precaution is taken to prevent this. The decision to have a blood transfusion has been considered carefully by your caregiver before blood is given. Blood is not given unless the benefits outweigh the risks. AFTER THE TRANSFUSION  Right after receiving a blood transfusion, you will usually  feel much better and more energetic. This is especially true if your red blood cells have gotten low (anemic). The transfusion raises the level of the red blood cells which carry oxygen, and this usually causes an energy increase.  The nurse administering the transfusion will monitor you carefully for complications. HOME CARE INSTRUCTIONS  No special instructions are needed after a transfusion. You may find your energy is better. Speak with your caregiver about any limitations on activity for underlying diseases you may have. SEEK MEDICAL CARE IF:   Your condition is not improving after your transfusion.  You develop redness or irritation at the intravenous (IV) site. SEEK IMMEDIATE MEDICAL CARE IF:  Any of the following symptoms occur over the next 12 hours:  Shaking chills.  You have a temperature by mouth above 102 F (38.9 C), not controlled by medicine.  Chest, back, or muscle pain.  People around you feel you are not acting correctly or are  confused.  Shortness of breath or difficulty breathing.  Dizziness and fainting.  You get a rash or develop hives.  You have a decrease in urine output.  Your urine turns a dark color or changes to pink, red, or brown. Any of the following symptoms occur over the next 10 days:  You have a temperature by mouth above 102 F (38.9 C), not controlled by medicine.  Shortness of breath.  Weakness after normal activity.  The white part of the eye turns yellow (jaundice).  You have a decrease in the amount of urine or are urinating less often.  Your urine turns a dark color or changes to pink, red, or brown. Document Released: 03/08/2000 Document Revised: 06/03/2011 Document Reviewed: 10/26/2007 ExitCare Patient Information 2014 [place].  _______________________________________________________________________  Incentive Spirometer  An incentive spirometer is a tool that can help keep your lungs clear and active. This tool measures how well you are filling your lungs with each breath. Taking long deep breaths may help reverse or decrease the chance of developing breathing (pulmonary) problems (especially infection) following:  A long period of time when you are unable to move or be active. BEFORE THE PROCEDURE   If the spirometer includes an indicator to show your best effort, your nurse or respiratory therapist will set it to a desired goal.  If possible, sit up straight or lean slightly forward. Try not to slouch.  Hold the incentive spirometer in an upright position. INSTRUCTIONS FOR USE  1. Sit on the edge of your bed if possible, or sit up as far as you can in bed or on a chair. 2. Hold the incentive spirometer in an upright position. 3. Breathe out normally. 4. Place the mouthpiece in your mouth and seal your lips tightly around it. 5. Breathe in slowly and as deeply as possible, raising the piston or the ball toward the top of the column. 6. Hold your breath for  3-5 seconds or for as long as possible. Allow the piston or ball to fall to the bottom of the column. 7. Remove the mouthpiece from your mouth and breathe out normally. 8. Rest for a few seconds and repeat Steps 1 through 7 at least 10 times every 1-2 hours when you are awake. Take your time and take a few normal breaths between deep breaths. 9. The spirometer may include an indicator to show your best effort. Use the indicator as a goal to work toward during each repetition. 10. After each set of 10 deep breaths, practice coughing to  be sure your lungs are clear. If you have an incision (the cut made at the time of surgery), support your incision when coughing by placing a pillow or rolled up towels firmly against it. Once you are able to get out of bed, walk around indoors and cough well. You may stop using the incentive spirometer when instructed by your caregiver.  RISKS AND COMPLICATIONS  Take your time so you do not get dizzy or light-headed.  If you are in pain, you may need to take or ask for pain medication before doing incentive spirometry. It is harder to take a deep breath if you are having pain. AFTER USE  Rest and breathe slowly and easily.  It can be helpful to keep track of a log of your progress. Your caregiver can provide you with a simple table to help with this. If you are using the spirometer at home, follow these instructions: [place] IF:   You are having difficultly using the spirometer.  You have trouble using the spirometer as often as instructed.  Your pain medication is not giving enough relief while using the spirometer.  You develop fever of 100.5 F (38.1 C) or higher. SEEK IMMEDIATE MEDICAL CARE IF:   You cough up bloody sputum that had not been present before.  You develop fever of 102 F (38.9 C) or greater.  You develop worsening pain at or near the incision site. MAKE SURE YOU:   Understand these instructions.  Will watch your  condition.  Will get help right away if you are not doing well or get worse. Document Released: 07/22/2006 Document Revised: 06/03/2011 Document Reviewed: 09/22/2006 [facility] Patient Information 2014 [place], [place].   ________________________________________________________________________

## 2017-01-09 ENCOUNTER — Encounter (HOSPITAL_COMMUNITY)
Admission: RE | Admit: 2017-01-09 | Discharge: 2017-01-09 | Disposition: A | Discharge status: Home / Self Care | Payer: Medicare Other | Source: Ambulatory Visit | Attending: Orthopedic Surgery | Admitting: Orthopedic Surgery

## 2017-01-09 ENCOUNTER — Encounter (HOSPITAL_COMMUNITY): Payer: Self-pay

## 2017-01-09 DIAGNOSIS — M1651 Unilateral post-traumatic osteoarthritis, right hip: Secondary | ICD-10-CM | POA: Insufficient documentation

## 2017-01-09 DIAGNOSIS — T1490XS Injury, unspecified, sequela: Secondary | ICD-10-CM | POA: Diagnosis not present

## 2017-01-09 DIAGNOSIS — Z7901 Long term (current) use of anticoagulants: Secondary | ICD-10-CM | POA: Diagnosis not present

## 2017-01-09 DIAGNOSIS — Z01818 Encounter for other preprocedural examination: Secondary | ICD-10-CM | POA: Insufficient documentation

## 2017-01-09 DIAGNOSIS — Z79899 Other long term (current) drug therapy: Secondary | ICD-10-CM | POA: Insufficient documentation

## 2017-01-09 DIAGNOSIS — E785 Hyperlipidemia, unspecified: Secondary | ICD-10-CM | POA: Insufficient documentation

## 2017-01-09 DIAGNOSIS — X58XXXS Exposure to other specified factors, sequela: Secondary | ICD-10-CM | POA: Insufficient documentation

## 2017-01-09 HISTORY — DX: Unilateral inguinal hernia, without obstruction or gangrene, not specified as recurrent: K40.90

## 2017-01-09 LAB — SURGICAL PCR SCREEN
MRSA, PCR: NEGATIVE
Staphylococcus aureus: NEGATIVE

## 2017-01-09 LAB — CBC
HEMATOCRIT: 41.4 % (ref 39.0–52.0)
HEMOGLOBIN: 14.4 g/dL (ref 13.0–17.0)
MCH: 32.7 pg (ref 26.0–34.0)
MCHC: 34.8 g/dL (ref 30.0–36.0)
MCV: 93.9 fL (ref 78.0–100.0)
Platelets: 215 10*3/uL (ref 150–400)
RBC: 4.41 MIL/uL (ref 4.22–5.81)
RDW: 14.4 % (ref 11.5–15.5)
WBC: 4.9 10*3/uL (ref 4.0–10.5)

## 2017-01-09 LAB — COMPREHENSIVE METABOLIC PANEL
ALK PHOS: 93 U/L (ref 38–126)
ALT: 24 U/L (ref 17–63)
ANION GAP: 10 (ref 5–15)
AST: 28 U/L (ref 15–41)
Albumin: 4.1 g/dL (ref 3.5–5.0)
BILIRUBIN TOTAL: 1.1 mg/dL (ref 0.3–1.2)
BUN: 24 mg/dL — ABNORMAL HIGH (ref 6–20)
CALCIUM: 9.1 mg/dL (ref 8.9–10.3)
CO2: 22 mmol/L (ref 22–32)
Chloride: 106 mmol/L (ref 101–111)
Creatinine, Ser: 0.89 mg/dL (ref 0.61–1.24)
GFR calc non Af Amer: >60 mL/min (ref >60)
Glucose, Bld: 108 mg/dL — ABNORMAL HIGH (ref 65–99)
Potassium: 4.3 mmol/L (ref 3.5–5.1)
Sodium: 138 mmol/L (ref 135–145)
TOTAL PROTEIN: 7.4 g/dL (ref 6.5–8.1)

## 2017-01-09 LAB — PROTIME-INR
INR: 1.04
PROTHROMBIN TIME: 13.5 s (ref 11.4–15.2)

## 2017-01-09 LAB — APTT: aPTT: 27 seconds (ref 24–36)

## 2017-01-09 NOTE — Progress Notes (Signed)
CMP done 01/09/17 routed to Dr. Wynelle Link via epic

## 2017-01-12 ENCOUNTER — Ambulatory Visit: Payer: Self-pay | Admitting: Orthopedic Surgery
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
## 2017-01-15 ENCOUNTER — Inpatient Hospital Stay (HOSPITAL_COMMUNITY): Payer: Medicare Other | Admitting: Certified Registered Nurse Anesthetist

## 2017-01-15 ENCOUNTER — Encounter (HOSPITAL_COMMUNITY): Payer: Self-pay | Admitting: *Deleted

## 2017-01-15 ENCOUNTER — Inpatient Hospital Stay (HOSPITAL_COMMUNITY): Payer: Medicare Other

## 2017-01-15 ENCOUNTER — Encounter (HOSPITAL_COMMUNITY)
Admission: RE | Disposition: A | Discharge status: Home Health | Payer: Self-pay | Source: Ambulatory Visit | Attending: Orthopedic Surgery

## 2017-01-15 ENCOUNTER — Inpatient Hospital Stay (HOSPITAL_COMMUNITY)
Admission: RE | Admit: 2017-01-15 | Discharge: 2017-01-17 | DRG: 470 | Disposition: A | Discharge status: Home Health | Payer: Medicare Other | Source: Ambulatory Visit | Attending: Orthopedic Surgery | Admitting: Orthopedic Surgery

## 2017-01-15 DIAGNOSIS — M1651 Unilateral post-traumatic osteoarthritis, right hip: Secondary | ICD-10-CM | POA: Diagnosis not present

## 2017-01-15 DIAGNOSIS — I2699 Other pulmonary embolism without acute cor pulmonale: Secondary | ICD-10-CM | POA: Diagnosis not present

## 2017-01-15 DIAGNOSIS — I739 Peripheral vascular disease, unspecified: Secondary | ICD-10-CM | POA: Diagnosis present

## 2017-01-15 DIAGNOSIS — M169 Osteoarthritis of hip, unspecified: Secondary | ICD-10-CM | POA: Diagnosis not present

## 2017-01-15 DIAGNOSIS — Z6831 Body mass index (BMI) 31.0-31.9, adult: Secondary | ICD-10-CM

## 2017-01-15 DIAGNOSIS — E785 Hyperlipidemia, unspecified: Secondary | ICD-10-CM | POA: Diagnosis present

## 2017-01-15 DIAGNOSIS — Z8546 Personal history of malignant neoplasm of prostate: Secondary | ICD-10-CM

## 2017-01-15 DIAGNOSIS — Z86711 Personal history of pulmonary embolism: Secondary | ICD-10-CM | POA: Diagnosis not present

## 2017-01-15 DIAGNOSIS — Z96641 Presence of right artificial hip joint: Secondary | ICD-10-CM | POA: Diagnosis not present

## 2017-01-15 DIAGNOSIS — Z8673 Personal history of transient ischemic attack (TIA), and cerebral infarction without residual deficits: Secondary | ICD-10-CM

## 2017-01-15 DIAGNOSIS — Z96652 Presence of left artificial knee joint: Secondary | ICD-10-CM | POA: Diagnosis not present

## 2017-01-15 DIAGNOSIS — M25551 Pain in right hip: Secondary | ICD-10-CM

## 2017-01-15 DIAGNOSIS — Z96649 Presence of unspecified artificial hip joint: Secondary | ICD-10-CM

## 2017-01-15 DIAGNOSIS — Z471 Aftercare following joint replacement surgery: Secondary | ICD-10-CM | POA: Diagnosis not present

## 2017-01-15 HISTORY — PX: TOTAL HIP ARTHROPLASTY: SHX124

## 2017-01-15 LAB — TYPE AND SCREEN
ABO/RH(D): A POS
Antibody Screen: NEGATIVE

## 2017-01-15 SURGERY — ARTHROPLASTY, HIP, TOTAL,POSTERIOR APPROACH
Anesthesia: Spinal | Site: Hip | Laterality: Right

## 2017-01-15 MED ORDER — DOCUSATE SODIUM 100 MG PO CAPS
100.0000 mg | ORAL_CAPSULE | Freq: Two times a day (BID) | ORAL | Status: DC
  Administered 2017-01-15 – 2017-01-17 (×4): 100 mg via ORAL
  Filled 2017-01-15 (×4): qty 1

## 2017-01-15 MED ORDER — BUPIVACAINE HCL (PF) 0.25 % IJ SOLN
INTRAMUSCULAR | Status: AC
  Filled 2017-01-15: qty 30

## 2017-01-15 MED ORDER — RIVAROXABAN 10 MG PO TABS
10.0000 mg | ORAL_TABLET | Freq: Every day | ORAL | Status: DC
  Administered 2017-01-16 – 2017-01-17 (×2): 10 mg via ORAL
  Filled 2017-01-15 (×2): qty 1

## 2017-01-15 MED ORDER — METHOCARBAMOL 500 MG PO TABS
500.0000 mg | ORAL_TABLET | Freq: Four times a day (QID) | ORAL | Status: DC | PRN
  Administered 2017-01-16 – 2017-01-17 (×2): 500 mg via ORAL
  Filled 2017-01-15 (×2): qty 1

## 2017-01-15 MED ORDER — PHENYLEPHRINE 40 MCG/ML (10ML) SYRINGE FOR IV PUSH (FOR BLOOD PRESSURE SUPPORT)
PREFILLED_SYRINGE | INTRAVENOUS | Status: AC
  Filled 2017-01-15: qty 10

## 2017-01-15 MED ORDER — MIDAZOLAM HCL 2 MG/2ML IJ SOLN: 0.5000 mg | Freq: Once | INTRAMUSCULAR | Status: DC | PRN

## 2017-01-15 MED ORDER — METOCLOPRAMIDE HCL 5 MG/ML IJ SOLN: 5.0000 mg | Freq: Three times a day (TID) | INTRAMUSCULAR | Status: DC | PRN

## 2017-01-15 MED ORDER — ACETAMINOPHEN 10 MG/ML IV SOLN
1000.0000 mg | Freq: Once | INTRAVENOUS | Status: AC
  Administered 2017-01-15: 1000 mg via INTRAVENOUS

## 2017-01-15 MED ORDER — CEFAZOLIN SODIUM-DEXTROSE 2-4 GM/100ML-% IV SOLN
2.0000 g | Freq: Four times a day (QID) | INTRAVENOUS | Status: AC
  Administered 2017-01-15 – 2017-01-16 (×2): 2 g via INTRAVENOUS
  Filled 2017-01-15 (×2): qty 100

## 2017-01-15 MED ORDER — SODIUM CHLORIDE 0.9 % IJ SOLN
INTRAMUSCULAR | Status: AC
  Filled 2017-01-15: qty 50

## 2017-01-15 MED ORDER — PROPOFOL 10 MG/ML IV BOLUS
INTRAVENOUS | Status: AC
  Filled 2017-01-15: qty 20

## 2017-01-15 MED ORDER — STERILE WATER FOR IRRIGATION IR SOLN
Status: DC | PRN
  Administered 2017-01-15: 2000 mL

## 2017-01-15 MED ORDER — PROMETHAZINE HCL 25 MG/ML IJ SOLN: 6.2500 mg | INTRAMUSCULAR | Status: DC | PRN

## 2017-01-15 MED ORDER — MENTHOL 3 MG MT LOZG: 1.0000 | LOZENGE | OROMUCOSAL | Status: DC | PRN

## 2017-01-15 MED ORDER — BUPIVACAINE HCL (PF) 0.75 % IJ SOLN
INTRAMUSCULAR | Status: DC | PRN
  Administered 2017-01-15: 2 mL via INTRATHECAL

## 2017-01-15 MED ORDER — DEXAMETHASONE SODIUM PHOSPHATE 10 MG/ML IJ SOLN
10.0000 mg | Freq: Once | INTRAMUSCULAR | Status: AC
  Administered 2017-01-16: 10 mg via INTRAVENOUS
  Filled 2017-01-15: qty 1

## 2017-01-15 MED ORDER — PHENYLEPHRINE HCL 10 MG/ML IJ SOLN
INTRAVENOUS | Status: DC | PRN
  Administered 2017-01-15: 50 ug/min via INTRAVENOUS

## 2017-01-15 MED ORDER — POLYETHYLENE GLYCOL 3350 17 G PO PACK: 17.0000 g | PACK | Freq: Every day | ORAL | Status: DC | PRN

## 2017-01-15 MED ORDER — DEXTROSE 5 % IV SOLN
500.0000 mg | Freq: Four times a day (QID) | INTRAVENOUS | Status: DC | PRN
  Filled 2017-01-15: qty 5

## 2017-01-15 MED ORDER — GLYCOPYRROLATE 0.2 MG/ML IV SOSY
PREFILLED_SYRINGE | INTRAVENOUS | Status: AC
  Filled 2017-01-15: qty 5

## 2017-01-15 MED ORDER — CHLORHEXIDINE GLUCONATE 4 % EX LIQD: 60.0000 mL | Freq: Once | CUTANEOUS | Status: DC

## 2017-01-15 MED ORDER — ONDANSETRON HCL 4 MG/2ML IJ SOLN
INTRAMUSCULAR | Status: DC | PRN
  Administered 2017-01-15: 4 mg via INTRAVENOUS

## 2017-01-15 MED ORDER — ONDANSETRON HCL 4 MG/2ML IJ SOLN
INTRAMUSCULAR | Status: AC
  Filled 2017-01-15: qty 2

## 2017-01-15 MED ORDER — OXYCODONE HCL 5 MG PO TABS
10.0000 mg | ORAL_TABLET | ORAL | Status: DC | PRN
  Administered 2017-01-15 – 2017-01-16 (×2): 10 mg via ORAL
  Filled 2017-01-15 (×3): qty 2

## 2017-01-15 MED ORDER — ACETAMINOPHEN 500 MG PO TABS
1000.0000 mg | ORAL_TABLET | Freq: Four times a day (QID) | ORAL | Status: AC
  Administered 2017-01-15 – 2017-01-16 (×4): 1000 mg via ORAL
  Filled 2017-01-15 (×4): qty 2

## 2017-01-15 MED ORDER — DEXAMETHASONE SODIUM PHOSPHATE 10 MG/ML IJ SOLN
10.0000 mg | Freq: Once | INTRAMUSCULAR | Status: AC
  Administered 2017-01-15: 10 mg via INTRAVENOUS

## 2017-01-15 MED ORDER — CEFAZOLIN SODIUM-DEXTROSE 2-4 GM/100ML-% IV SOLN
2.0000 g | INTRAVENOUS | Status: AC
  Administered 2017-01-15: 2 g via INTRAVENOUS

## 2017-01-15 MED ORDER — BUPIVACAINE-EPINEPHRINE 0.25% -1:200000 IJ SOLN
INTRAMUSCULAR | Status: DC | PRN
  Administered 2017-01-15: 30 mL

## 2017-01-15 MED ORDER — DEXAMETHASONE SODIUM PHOSPHATE 10 MG/ML IJ SOLN
INTRAMUSCULAR | Status: AC
  Filled 2017-01-15: qty 1

## 2017-01-15 MED ORDER — PHENOL 1.4 % MT LIQD
1.0000 | OROMUCOSAL | Status: DC | PRN
  Filled 2017-01-15: qty 177

## 2017-01-15 MED ORDER — MIDAZOLAM HCL 5 MG/5ML IJ SOLN
INTRAMUSCULAR | Status: DC | PRN
  Administered 2017-01-15 (×2): 1 mg via INTRAVENOUS

## 2017-01-15 MED ORDER — GLYCOPYRROLATE 0.2 MG/ML IJ SOLN
INTRAMUSCULAR | Status: DC | PRN
  Administered 2017-01-15: 0.2 mg via INTRAVENOUS

## 2017-01-15 MED ORDER — ACETAMINOPHEN 650 MG RE SUPP: 650.0000 mg | RECTAL | Status: DC | PRN

## 2017-01-15 MED ORDER — BUPIVACAINE IN DEXTROSE 0.75-8.25 % IT SOLN
INTRATHECAL | Status: DC | PRN
  Administered 2017-01-15: 15 mg via INTRATHECAL

## 2017-01-15 MED ORDER — ACETAMINOPHEN 10 MG/ML IV SOLN
INTRAVENOUS | Status: AC
  Filled 2017-01-15: qty 100

## 2017-01-15 MED ORDER — PROPOFOL 10 MG/ML IV BOLUS
INTRAVENOUS | Status: AC
  Filled 2017-01-15: qty 40

## 2017-01-15 MED ORDER — FLEET ENEMA 7-19 GM/118ML RE ENEM: 1.0000 | ENEMA | Freq: Once | RECTAL | Status: DC | PRN

## 2017-01-15 MED ORDER — SODIUM CHLORIDE 0.9 % IR SOLN
Status: DC | PRN
  Administered 2017-01-15: 1000 mL

## 2017-01-15 MED ORDER — ACETAMINOPHEN 325 MG PO TABS
650.0000 mg | ORAL_TABLET | ORAL | Status: DC | PRN
  Administered 2017-01-17 (×2): 650 mg via ORAL
  Filled 2017-01-15 (×2): qty 2

## 2017-01-15 MED ORDER — MEPERIDINE HCL 50 MG/ML IJ SOLN: 6.2500 mg | INTRAMUSCULAR | Status: DC | PRN

## 2017-01-15 MED ORDER — HYDROMORPHONE HCL 1 MG/ML IJ SOLN: 0.2500 mg | INTRAMUSCULAR | Status: DC | PRN

## 2017-01-15 MED ORDER — SODIUM CHLORIDE 0.9 % IV SOLN
INTRAVENOUS | Status: DC
  Administered 2017-01-16: 05:00:00 via INTRAVENOUS

## 2017-01-15 MED ORDER — BUPIVACAINE-EPINEPHRINE (PF) 0.25% -1:200000 IJ SOLN
INTRAMUSCULAR | Status: AC
  Filled 2017-01-15: qty 30

## 2017-01-15 MED ORDER — ONDANSETRON HCL 4 MG/2ML IJ SOLN: 4.0000 mg | Freq: Four times a day (QID) | INTRAMUSCULAR | Status: DC | PRN

## 2017-01-15 MED ORDER — TRAMADOL HCL 50 MG PO TABS: 50.0000 mg | ORAL_TABLET | Freq: Four times a day (QID) | ORAL | Status: DC | PRN

## 2017-01-15 MED ORDER — MORPHINE SULFATE (PF) 4 MG/ML IV SOLN: 1.0000 mg | INTRAVENOUS | Status: DC | PRN

## 2017-01-15 MED ORDER — MIDAZOLAM HCL 2 MG/2ML IJ SOLN
INTRAMUSCULAR | Status: AC
  Filled 2017-01-15: qty 2

## 2017-01-15 MED ORDER — BISACODYL 10 MG RE SUPP: 10.0000 mg | Freq: Every day | RECTAL | Status: DC | PRN

## 2017-01-15 MED ORDER — METOCLOPRAMIDE HCL 5 MG PO TABS
5.0000 mg | ORAL_TABLET | Freq: Three times a day (TID) | ORAL | Status: DC | PRN
Start: 2017-01-15 — End: 2017-01-17

## 2017-01-15 MED ORDER — OXYCODONE HCL 5 MG PO TABS
5.0000 mg | ORAL_TABLET | ORAL | Status: DC | PRN
  Administered 2017-01-16 – 2017-01-17 (×2): 5 mg via ORAL
  Filled 2017-01-15: qty 1

## 2017-01-15 MED ORDER — FENTANYL CITRATE (PF) 100 MCG/2ML IJ SOLN
INTRAMUSCULAR | Status: DC | PRN
  Administered 2017-01-15: 50 ug via INTRAVENOUS

## 2017-01-15 MED ORDER — ONDANSETRON HCL 4 MG PO TABS: 4.0000 mg | ORAL_TABLET | Freq: Four times a day (QID) | ORAL | Status: DC | PRN

## 2017-01-15 MED ORDER — TRANEXAMIC ACID 1000 MG/10ML IV SOLN
1000.0000 mg | INTRAVENOUS | Status: AC
  Administered 2017-01-15: 1000 mg via INTRAVENOUS
  Filled 2017-01-15: qty 1100

## 2017-01-15 MED ORDER — LACTATED RINGERS IV SOLN
INTRAVENOUS | Status: DC
  Administered 2017-01-15 (×3): via INTRAVENOUS

## 2017-01-15 MED ORDER — PROPOFOL 500 MG/50ML IV EMUL
INTRAVENOUS | Status: DC | PRN
  Administered 2017-01-15: 75 ug/kg/min via INTRAVENOUS

## 2017-01-15 MED ORDER — DIPHENHYDRAMINE HCL 12.5 MG/5ML PO ELIX: 12.5000 mg | ORAL_SOLUTION | ORAL | Status: DC | PRN

## 2017-01-15 MED ORDER — LIDOCAINE 2% (20 MG/ML) 5 ML SYRINGE
INTRAMUSCULAR | Status: AC
  Filled 2017-01-15: qty 5

## 2017-01-15 MED ORDER — FENTANYL CITRATE (PF) 100 MCG/2ML IJ SOLN
INTRAMUSCULAR | Status: AC
  Filled 2017-01-15: qty 2

## 2017-01-15 MED ORDER — CEFAZOLIN SODIUM-DEXTROSE 2-4 GM/100ML-% IV SOLN
INTRAVENOUS | Status: AC
  Filled 2017-01-15: qty 100

## 2017-01-15 SURGICAL SUPPLY — 59 items
BAG DECANTER FOR FLEXI CONT (MISCELLANEOUS) ×2 IMPLANT
BAG SPEC THK2 15X12 ZIP CLS (MISCELLANEOUS)
BAG ZIPLOCK 12X15 (MISCELLANEOUS) IMPLANT
BIT DRILL 2.8X128 (BIT) ×2 IMPLANT
BLADE EXTENDED COATED 6.5IN (ELECTRODE) ×2 IMPLANT
BLADE SAW SAG 73X25 THK (BLADE) ×1
BLADE SAW SGTL 73X25 THK (BLADE) ×1 IMPLANT
CAPT HIP TOTAL 2 ×1 IMPLANT
COVER SURGICAL LIGHT HANDLE (MISCELLANEOUS) ×2 IMPLANT
DECANTER SPIKE VIAL GLASS SM (MISCELLANEOUS) ×2 IMPLANT
DRAPE INCISE IOBAN 66X45 STRL (DRAPES) ×2 IMPLANT
DRAPE ORTHO SPLIT 77X108 STRL (DRAPES) ×4
DRAPE POUCH INSTRU U-SHP 10X18 (DRAPES) ×2 IMPLANT
DRAPE SHEET LG 3/4 BI-LAMINATE (DRAPES) ×3 IMPLANT
DRAPE SURG ORHT 6 SPLT 77X108 (DRAPES) ×2 IMPLANT
DRAPE U-SHAPE 47X51 STRL (DRAPES) ×2 IMPLANT
DRSG ADAPTIC 3X8 NADH LF (GAUZE/BANDAGES/DRESSINGS) ×2 IMPLANT
DRSG MEPILEX BORDER 4X12 (GAUZE/BANDAGES/DRESSINGS) ×1 IMPLANT
DRSG MEPILEX BORDER 4X4 (GAUZE/BANDAGES/DRESSINGS) ×2 IMPLANT
DRSG MEPILEX BORDER 4X8 (GAUZE/BANDAGES/DRESSINGS) ×3 IMPLANT
DURAPREP 26ML APPLICATOR (WOUND CARE) ×2 IMPLANT
ELECT REM PT RETURN 15FT ADLT (MISCELLANEOUS) ×2 IMPLANT
EVACUATOR 1/8 PVC DRAIN (DRAIN) ×2 IMPLANT
FACESHIELD WRAPAROUND (MASK) ×8 IMPLANT
FACESHIELD WRAPAROUND OR TEAM (MASK) ×4 IMPLANT
GAUZE SPONGE 4X4 12PLY STRL (GAUZE/BANDAGES/DRESSINGS) ×2 IMPLANT
GLOVE BIO SURGEON STRL SZ7.5 (GLOVE) IMPLANT
GLOVE BIO SURGEON STRL SZ8 (GLOVE) ×2 IMPLANT
GLOVE BIOGEL PI IND STRL 8 (GLOVE) ×1 IMPLANT
GLOVE BIOGEL PI INDICATOR 8 (GLOVE) ×1
GLOVE SURG SS PI 6.5 STRL IVOR (GLOVE) IMPLANT
GOWN STRL REUS W/TWL LRG LVL3 (GOWN DISPOSABLE) ×2 IMPLANT
GOWN STRL REUS W/TWL XL LVL3 (GOWN DISPOSABLE) IMPLANT
GUIDEWIRE BALL NOSE 80CM (WIRE) ×1 IMPLANT
IMMOBILIZER KNEE 20 (SOFTGOODS) ×2
IMMOBILIZER KNEE 20 THIGH 36 (SOFTGOODS) ×1 IMPLANT
MANIFOLD NEPTUNE II (INSTRUMENTS) ×2 IMPLANT
MARKER SKIN DUAL TIP RULER LAB (MISCELLANEOUS) ×2 IMPLANT
NDL SAFETY ECLIPSE 18X1.5 (NEEDLE) ×3 IMPLANT
NEEDLE HYPO 18GX1.5 SHARP (NEEDLE) ×6
NS IRRIG 1000ML POUR BTL (IV SOLUTION) ×2 IMPLANT
PADDING CAST COTTON 6X4 STRL (CAST SUPPLIES) ×2 IMPLANT
PASSER SUT SWANSON 36MM LOOP (INSTRUMENTS) ×2 IMPLANT
POSITIONER SURGICAL ARM (MISCELLANEOUS) ×2 IMPLANT
STRIP CLOSURE SKIN 1/2X4 (GAUZE/BANDAGES/DRESSINGS) ×4 IMPLANT
SUT ETHIBOND NAB CT1 #1 30IN (SUTURE) ×4 IMPLANT
SUT MNCRL AB 4-0 PS2 18 (SUTURE) ×2 IMPLANT
SUT STRATAFIX 0 PDS 27 VIOLET (SUTURE) ×4
SUT VIC AB 2-0 CT1 27 (SUTURE) ×6
SUT VIC AB 2-0 CT1 TAPERPNT 27 (SUTURE) ×3 IMPLANT
SUT VLOC 180 0 24IN GS25 (SUTURE) ×2 IMPLANT
SUTURE STRATFX 0 PDS 27 VIOLET (SUTURE) IMPLANT
SYR 20CC LL (SYRINGE) ×2 IMPLANT
SYR 50ML LL SCALE MARK (SYRINGE) ×4 IMPLANT
TOWEL OR 17X26 10 PK STRL BLUE (TOWEL DISPOSABLE) ×4 IMPLANT
TOWEL OR NON WOVEN STRL DISP B (DISPOSABLE) ×2 IMPLANT
TRAY FOLEY W/METER SILVER 16FR (SET/KITS/TRAYS/PACK) ×2 IMPLANT
WATER STERILE IRR 1500ML POUR (IV SOLUTION) ×2 IMPLANT
YANKAUER SUCT BULB TIP 10FT TU (MISCELLANEOUS) ×2 IMPLANT

## 2017-01-15 NOTE — Transfer of Care (Signed)
Immediate Anesthesia Transfer of Care Note  Patient: Isaac Powers  Procedure(s) Performed: RIGHT TOTAL HIP ARTHROPLASTY, posterior; possible femoral osteotomy (Right Hip)  Patient Location: PACU  Anesthesia Type:Spinal  Level of Consciousness: awake, alert , oriented and patient cooperative  Airway & Oxygen Therapy: Patient Spontanous Breathing and Patient connected to face mask  Post-op Assessment: Report given to RN and Post -op Vital signs reviewed and stable  Post vital signs: stable  Last Vitals:  Vitals:   01/15/17 1209  BP: (!) 169/88  Pulse: 65  Resp: 18  Temp: 36.4 C  SpO2: 96%    Last Pain:  Vitals:   01/15/17 1209  TempSrc: Oral         Complications: No apparent anesthesia complications

## 2017-01-15 NOTE — Anesthesia Postprocedure Evaluation (Signed)
Anesthesia Post Note  Patient: Sixto Bowdish  Procedure(s) Performed: RIGHT TOTAL HIP ARTHROPLASTY, posterior; possible femoral osteotomy (Right Hip)     Patient location during evaluation: PACU Anesthesia Type: Spinal Level of consciousness: awake and alert, patient cooperative and oriented Pain management: pain level controlled Vital Signs Assessment: post-procedure vital signs reviewed and stable Respiratory status: spontaneous breathing, nonlabored ventilation and respiratory function stable Cardiovascular status: blood pressure returned to baseline and stable Postop Assessment: patient able to bend at knees and spinal receding Anesthetic complications: no    Last Vitals:  Vitals:   01/15/17 1815 01/15/17 1826  BP: 140/90 (!) 164/93  Pulse: 63 63  Resp: 12 12  Temp:  (!) 36.3 C  SpO2: 100% 100%    Last Pain:  Vitals:   01/15/17 1826  TempSrc:   PainSc: 0-No pain    LLE Motor Response: (P) Purposeful movement (01/15/17 1826) LLE Sensation: (P) Numbness (01/15/17 1826) RLE Motor Response: (P) Purposeful movement (01/15/17 1826) RLE Sensation: (P) Numbness (01/15/17 1826) L Sensory Level: (P) L4-Anterior knee, lower leg (01/15/17 1826) R Sensory Level: (P) S1-Sole of foot, small toes (01/15/17 1826)  Zeppelin Commisso,E. Jillianna Stanek

## 2017-01-15 NOTE — Anesthesia Procedure Notes (Signed)
Spinal  Patient location during procedure: OR End time: 01/15/2017 2:34 PM Staffing Anesthesiologist: Annye Asa Performed: anesthesiologist  Preanesthetic Checklist Completed: patient identified, site marked, surgical consent, pre-op evaluation, timeout performed, IV checked, risks and benefits discussed and monitors and equipment checked Spinal Block Patient position: sitting Prep: ChloraPrep and site prepped and draped Patient monitoring: blood pressure, continuous pulse ox, cardiac monitor and heart rate Approach: midline Location: L3-4 Injection technique: single-shot Needle Needle type: Quincke  Needle gauge: 22 G Needle length: 9 cm Additional Notes Pt identified in Operating room.  Monitors applied. Working IV access confirmed. Sterile prep, drape lumbar spine.  1% lido local L 3,4.  After attempts by CRNA, #22 ga Quincke into clear CSF L 3,4.  15mg  0.75% Bupivacaine with dextrose injected with CSF flow beginning and end of injection.  Patient asymptomatic, VSS, no heme aspirated, tolerated well.  Jenita Seashore, MD
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
## 2017-01-15 NOTE — Interval H&P Note (Signed)
History and Physical Interval Note:  01/15/2017 1:07 PM  Isaac Powers  has presented today for surgery, with the diagnosis of Post-traumatic osteoarthritis Right Hip  The various methods of treatment have been discussed with the patient and family. After consideration of risks, benefits and other options for treatment, the patient has consented to  Procedure(s) with comments: RIGHT TOTAL HIP ARTHROPLASTY, posterior; possible femoral osteotomy (Right) - requests 3hours as a surgical intervention .  The patient's history has been reviewed, patient examined, no change in status, stable for surgery.  I have reviewed the patient's chart and labs.  Questions were answered to the patient's satisfaction.     Gearlean Alf

## 2017-01-15 NOTE — Op Note (Signed)
Pre-operative diagnosis- Osteoarthritis Right hip  Post-operative diagnosis- Osteoarthritis  Right hip  Procedure-  RightTotal Hip Arthroplasty  Surgeon- Isaac Powers. Isaac Loa, MD  Assistant- Isaac Muslim, PA-C   Anesthesia  Spinal  EBL- 600 mL   Drain Hemovac   Complication- None  Condition-PACU - hemodynamically stable.   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 He has severe pain and dysfunction and presents for right Total Hip Arthroplasty with possible femoral osteotomy if the deformity prevents passage of a standard hip stem  Procedure in detail-   The patient is brought into the operating room and placed on the operating table. After successful administration of Spinal  anesthesia, the patient is placed in the  Left lateral decubitus position with the  Right side up and held in place with the hip positioner. The lower extremity is isolated from the perineum with plastic drapes and time-out is performed by the surgical team. The lower extremity is then prepped and draped in the usual sterile fashion. A short posterolateral incision is made with a ten blade through the subcutaneous tissue to the level of the fascia lata which is incised in line with the skin incision. The sciatic nerve is palpated and protected and the short external rotators and capsule are isolated from the femur. The hip is then dislocated and the center of the femoral head is marked. A trial prosthesis is placed such that the trial head corresponds to the center of the patients' native femoral head. The resection level is marked on the femoral neck and the resection is made with an oscillating saw. The femoral head is removed and femoral retractors placed to gain access to the femoral canal.      The canal finder is passed into the femoral canal and the canal is thoroughly irrigated with sterile  saline to remove the fatty contents. I placed a long beaded guide rod into the femoral canal and reamed with flexible reamers up to 15.5 mm which had excellent cortical chatter. I confirmed that the reaming occurred within the femoral canal without perforation. Axial reaming is then  performed to 15.5  mm, proximal reaming to 42F  and the sleeve machined to a large. A 20 F large trial sleeve is placed into the proximal femur.      The femur is then retracted anteriorly to gain acetabular exposure. Acetabular retractors are placed and the labrum and osteophytes are removed, Acetabular reaming is performed to 53  mm and a 54  mm Pinnacle acetabular shell is placed in anatomic position with excellent purchase. Additional dome screws were not needed. The permanent 36 mm neutral + 4 Marathon liner is placed into the acetabular shell.      The trial femur is then placed into the femoral canal. The size is 20 x 15  stem with a 36 + 8  neck and a 36 + 9 head with the neck version 10 degrees beyond  the patients' native anteversion. The hip is reduced with excellent stability with full extension and full external rotation, 70 degrees flexion with 40 degrees adduction and 90 degrees internal rotation and 90 degrees of flexion with 70 degrees of internal rotation. The operative leg is placed on top of the non-operative leg and the leg lengths are found to be nearly equal. He was nearly one inch short pre-operatively. The trials are then removed and the permanent implant  of the same size is impacted into the femoral canal. The ceramic femoral head of the same size as the trial is placed and the hip is reduced with the same stability parameters. The operative leg is again placed on top of the non-operative leg and the leg lengths are found to be equal.      The wound is then copiously irrigated with saline solution and the capsule and short external rotators are re-attached to the femur through drill holes with Ethibond  suture. The fascia lata is closed over a hemovac drain with #1 Stratofix suture and the fascia lata, gluteal muscles and subcutaneous tissues are injected with 30 ml of .25% Marcaine. The subcutaneous tissues are closed with #1 Stratofix and2-0 vicryl and the subcuticular layer closed with running 4-0 Monocryl. The drain is hooked to suction, incision cleaned and dried, and steri-srips and a bulky sterile dressing applied. The limb is placed into a knee immobilizer and the patient is awakened and transported to recovery in stable condition.      Please note that a surgical assistant was a medical necessity for this procedure in order to perform it in a safe and expeditious manner. The assistant was necessary to provide retraction to the vital neurovascular structures and to retract and position the limb to allow for anatomic placement of the prosthetic components.  Isaac Powers Isaac Machnik, MD    01/15/2017, 4:24 PM

## 2017-01-15 NOTE — Discharge Instructions (Addendum)
Dr. Gaynelle Arabian Total Joint Specialist [facility] [address] [place], [place] [address] [phone]   POSTERIOR TOTAL HIP REPLACEMENT POSTOPERATIVE DIRECTIONS  Hip Rehabilitation, Guidelines Following Surgery  The results of a hip operation are greatly improved after range of motion and muscle strengthening exercises. Follow all safety measures which are given to protect your hip. If any of these exercises cause increased pain or swelling in your joint, decrease the amount until you are comfortable again. Then slowly increase the exercises. Call your caregiver if you have problems or questions.   HOME CARE INSTRUCTIONS  Remove items at home which could result in a fall. This includes throw rugs or furniture in walking pathways.   ICE to the affected hip every three hours for 30 minutes at a time and then as needed for pain and swelling.  Continue to use ice on the hip for pain and swelling from surgery. You may notice swelling that will progress down to the foot and ankle.  This is normal after surgery.  Elevate the leg when you are not up walking on it.    Continue to use the breathing machine which will help keep your temperature down.  It is common for your temperature to cycle up and down following surgery, especially at night when you are not up moving around and exerting yourself.  The breathing machine keeps your lungs expanded and your temperature down.  DIET You may resume your previous home diet once your are discharged from the hospital.  DRESSING / WOUND CARE / SHOWERING You may shower 3 days after surgery, but keep the wounds dry during showering.  You may use an occlusive plastic wrap (Press'n Seal for example), NO SOAKING/SUBMERGING IN THE BATHTUB.  If the bandage gets wet, change with a clean dry gauze.  If the incision gets wet, pat the wound dry with a clean towel. You may start showering once you are discharged home but do not submerge  the incision under water. Just pat the incision dry and apply a dry gauze dressing on daily. Change the surgical dressing daily and reapply a dry dressing each time.    ACTIVITY Walk with your walker as instructed. Use walker as long as suggested by your caregivers. Avoid periods of inactivity such as sitting longer than an hour when not asleep. This helps prevent blood clots.  You may resume a sexual relationship in one month or when given the OK by your doctor.  You may return to work once you are cleared by your doctor.  Do not drive a car for 6 weeks or until released by you surgeon.  Do not drive while taking narcotics.  WEIGHT BEARING Weight bearing as tolerated with assist device (walker, cane, etc) as directed, use it as long as suggested by your surgeon or therapist, typically at least 4-6 weeks.  POSTOPERATIVE CONSTIPATION PROTOCOL Constipation - defined medically as fewer than three stools per week and severe constipation as less than one stool per week.  One of the most common issues patients have following surgery is constipation.  Even if you have a regular bowel pattern at home, your normal regimen is likely to be disrupted due to multiple reasons following surgery.  Combination of anesthesia, postoperative narcotics, change in appetite and fluid intake all can affect your bowels.  In order to avoid complications following surgery, here are some recommendations in order to help you during your recovery period.  Colace (docusate) - Pick up an over-the-counter  form of Colace or another stool softener and take twice a day as long as you are requiring postoperative pain medications.  Take with a full glass of water daily.  If you experience loose stools or diarrhea, hold the colace until you stool forms back up.  If your symptoms do not get better within 1 week or if they get worse, check with your doctor.  Dulcolax (bisacodyl) - Pick up over-the-counter and take as directed by the  product packaging as needed to assist with the movement of your bowels.  Take with a full glass of water.  Use this product as needed if not relieved by Colace only.   MiraLax (polyethylene glycol) - Pick up over-the-counter to have on hand.  MiraLax is a solution that will increase the amount of water in your bowels to assist with bowel movements.  Take as directed and can mix with a glass of water, juice, soda, coffee, or tea.  Take if you go more than two days without a movement. Do not use MiraLax more than once per day. Call your doctor if you are still constipated or irregular after using this medication for 7 days in a row.  If you continue to have problems with postoperative constipation, please contact the office for further assistance and recommendations.  If you experience "the worst abdominal pain ever" or develop nausea or vomiting, please contact the office immediatly for further recommendations for treatment.  ITCHING  If you experience itching with your medications, try taking only a single pain pill, or even half a pain pill at a time.  You can also use Benadryl over the counter for itching or also to help with sleep.   TED HOSE STOCKINGS Wear the elastic stockings on both legs for three weeks following surgery during the day but you may remove then at night for sleeping.  MEDICATIONS See your medication summary on the After Visit Summary that the nursing staff will review with you prior to discharge.  You may have some home medications which will be placed on hold until you complete the course of blood thinner medication.  It is important for you to complete the blood thinner medication as prescribed by your surgeon.  Continue your approved medications as instructed at time of discharge.  PRECAUTIONS If you experience chest pain or shortness of breath - call 911 immediately for transfer to the hospital emergency department.  If you develop a fever greater that 101 F, purulent  drainage from wound, increased redness or drainage from wound, foul odor from the wound/dressing, or calf pain - CONTACT YOUR SURGEON.                                                   FOLLOW-UP APPOINTMENTS Make sure you keep all of your appointments after your operation with your surgeon and caregivers. You should call the office at the above phone number and make an appointment for approximately two weeks after the date of your surgery or on the date instructed by your surgeon outlined in the "After Visit Summary".  RANGE OF MOTION AND STRENGTHENING EXERCISES  These exercises are designed to help you keep full movement of your hip joint. Follow your caregiver's or physical therapist's instructions. Perform all exercises about fifteen times, three times per day or as directed. Exercise both hips, even if you  have had only one joint replacement. These exercises can be done on a training (exercise) mat, on the floor, on a table or on a bed. Use whatever works the best and is most comfortable for you. Use music or television while you are exercising so that the exercises are a pleasant break in your day. This will make your life better with the exercises acting as a break in routine you can look forward to.  Lying on your back, slowly slide your foot toward your buttocks, raising your knee up off the floor. Then slowly slide your foot back down until your leg is straight again.  Lying on your back spread your legs as far apart as you can without causing discomfort.  Lying on your side, raise your upper leg and foot straight up from the floor as far as is comfortable. Slowly lower the leg and repeat.  Lying on your back, tighten up the muscle in the front of your thigh (quadriceps muscles). You can do this by keeping your leg straight and trying to raise your heel off the floor. This helps strengthen the largest muscle supporting your knee.  Lying on your back, tighten up the muscles of your buttocks both  with the legs straight and with the knee bent at a comfortable angle while keeping your heel on the floor.      IF YOU ARE TRANSFERRED TO A SKILLED REHAB FACILITY If the patient is transferred to a skilled rehab facility following release from the hospital, a list of the current medications will be sent to the facility for the patient to continue.  When discharged from the skilled rehab facility, please have the facility set up the patient's [place] prior to being released. Also, the skilled facility will be responsible for providing the patient with their medications at time of release from the facility to include their pain medication, the muscle relaxants, and their blood thinner medication. If the patient is still at the rehab facility at time of the two week follow up appointment, the skilled rehab facility will also need to assist the patient in arranging follow up appointment in our office and any transportation needs.  MAKE SURE YOU:  Understand these instructions.  Get help right away if you are not doing well or get worse.    Pick up stool softner and laxative for home use following surgery while on pain medications. Do not submerge incision under water. Please use good hand washing techniques while changing dressing each day. May shower starting three days after surgery. Please use a clean towel to pat the incision dry following showers. Continue to use ice for pain and swelling after surgery. Do not use any lotions or creams on the incision until instructed by your surgeon.  Take Xarelto for two and a half more weeks following discharge from the hospital, then discontinue Xarelto. Once the patient has completed the blood thinner regimen, then take a Baby 81 mg Aspirin daily for three more weeks.  Information on my medicine - XARELTO (Rivaroxaban)  This medication education was reviewed with me or my healthcare representative as part of my discharge  preparation.  The pharmacist that spoke with me during my hospital stay was:    Why was Xarelto prescribed for you? Xarelto was prescribed for you to reduce the risk of blood clots forming after orthopedic surgery. The medical term for these abnormal blood clots is venous thromboembolism (VTE).  What do you need to know about xarelto ?  Take your Xarelto ONCE DAILY at the same time every day. You may take it either with or without food.  If you have difficulty swallowing the tablet whole, you may crush it and mix in applesauce just prior to taking your dose.  Take Xarelto exactly as prescribed by your doctor and DO NOT stop taking Xarelto without talking to the doctor who prescribed the medication.  Stopping without other VTE prevention medication to take the place of Xarelto may increase your risk of developing a clot.  After discharge, you should have regular check-up appointments with your healthcare provider that is prescribing your Xarelto.    What do you do if you miss a dose? If you miss a dose, take it as soon as you remember on the same day then continue your regularly scheduled once daily regimen the next day. Do not take two doses of Xarelto on the same day.   Important Safety Information A possible side effect of Xarelto is bleeding. You should call your healthcare provider right away if you experience any of the following: ? Bleeding from an injury or your nose that does not stop. ? Unusual colored urine (red or dark brown) or unusual colored stools (red or black). ? Unusual bruising for unknown reasons. ? A serious fall or if you hit your head (even if there is no bleeding).  Some medicines may interact with Xarelto and might increase your risk of bleeding while on Xarelto. To help avoid this, consult your healthcare provider or pharmacist prior to using any new prescription or non-prescription medications, including herbals, vitamins, non-steroidal anti-inflammatory  drugs (NSAIDs) and supplements.  This website has more information on Xarelto: [email or web address]

## 2017-01-16 ENCOUNTER — Encounter (HOSPITAL_COMMUNITY): Payer: Self-pay | Admitting: Orthopedic Surgery

## 2017-01-16 LAB — CBC
HEMATOCRIT: 35.6 % — AB (ref 39.0–52.0)
HEMOGLOBIN: 12.5 g/dL — AB (ref 13.0–17.0)
MCH: 32.5 pg (ref 26.0–34.0)
MCHC: 35.1 g/dL (ref 30.0–36.0)
MCV: 92.5 fL (ref 78.0–100.0)
Platelets: 221 10*3/uL (ref 150–400)
RBC: 3.85 MIL/uL — ABNORMAL LOW (ref 4.22–5.81)
RDW: 13.8 % (ref 11.5–15.5)
WBC: 12 10*3/uL — ABNORMAL HIGH (ref 4.0–10.5)

## 2017-01-16 LAB — BASIC METABOLIC PANEL
Anion gap: 8 (ref 5–15)
BUN: 14 mg/dL (ref 6–20)
CALCIUM: 8.5 mg/dL — AB (ref 8.9–10.3)
CHLORIDE: 105 mmol/L (ref 101–111)
CO2: 23 mmol/L (ref 22–32)
CREATININE: 0.89 mg/dL (ref 0.61–1.24)
GFR calc non Af Amer: >60 mL/min (ref >60)
GLUCOSE: 155 mg/dL — AB (ref 65–99)
Potassium: 4.6 mmol/L (ref 3.5–5.1)
Sodium: 136 mmol/L (ref 135–145)

## 2017-01-16 MED ORDER — TRAMADOL HCL 50 MG PO TABS: 50.0000 mg | ORAL_TABLET | Freq: Four times a day (QID) | ORAL | Status: DC | PRN

## 2017-01-16 NOTE — Evaluation (Signed)
Physical Therapy Evaluation Patient Details Name: Isaac Powers MRN: [id number] DOB: [birth year] Today's Date: 01/16/2017   History of Present Illness  s/p R THA, posterior approach.  H/o TKA  Clinical Impression  Pt s/p R THR and presents with decreased R LE strength/ROM, post op pain and posterior THP limiting functional mobility.  Pt should progress to dc home with family assist and HHPT follow up.    Follow Up Recommendations Home health PT    Equipment Recommendations  Rolling walker with 5" wheels    Recommendations for Other Services OT consult     Precautions / Restrictions Precautions Precautions: Posterior Hip;Fall Precaution Booklet Issued: Yes (comment) Restrictions Weight Bearing Restrictions: No      Mobility  Bed Mobility Overal bed mobility: Needs Assistance Bed Mobility: Supine to Sit     Supine to sit: Min assist     General bed mobility comments: Cues for sequence, adherence to THP and use of L LE to self assist  Transfers Overall transfer level: Needs assistance Equipment used: Rolling walker (2 wheeled) Transfers: Sit to/from Stand Sit to Stand: Min guard         General transfer comment: cues for UE/LE placement  Ambulation/Gait Ambulation/Gait assistance: Min assist Ambulation Distance (Feet): 98 Feet Assistive device: Rolling walker (2 wheeled) Gait Pattern/deviations: Step-to pattern;Step-through pattern;Decreased step length - right;Decreased step length - left;Shuffle;Trunk flexed Gait velocity: decr Gait velocity interpretation: Below normal speed for age/gender General Gait Details: cues for sequence, posture, position from RW and ER on R  Stairs            Wheelchair Mobility    Modified Rankin (Stroke Patients Only)       Balance                                             Pertinent Vitals/Pain Pain Assessment: 0-10 Pain Score: 3  Faces Pain Scale: Hurts little more Pain Location:  posterior hip, r side Pain Descriptors / Indicators: Aching Pain Intervention(s): Limited activity within patient's tolerance;Monitored during session;Premedicated before session;Ice applied    Home Living Family/patient expects to be discharged to:: Private residence Living Arrangements: Spouse/significant other Available Help at Discharge: Available PRN/intermittently Type of Home: House Home Access: Stairs to enter Entrance Stairs-Rails: Psychiatric nurse of Steps: 3 Home Layout: One level Home Equipment: Bedside commode;Shower seat - built in;Crutches      Prior Function Level of Independence: Independent               Hand Dominance        Extremity/Trunk Assessment   Upper Extremity Assessment Upper Extremity Assessment: Overall WFL for tasks assessed         Cervical / Trunk Assessment Cervical / Trunk Assessment: Kyphotic  Communication   Communication: No difficulties  Cognition Arousal/Alertness: Awake/alert Behavior During Therapy: Impulsive Overall Cognitive Status: Within Functional Limits for tasks assessed                                        General Comments      Exercises Total Joint Exercises Ankle Circles/Pumps: AROM;Both;15 reps;Supine Quad Sets: AROM;Both;10 reps;Supine Heel Slides: AAROM;Right;20 reps;Supine Hip ABduction/ADduction: AAROM;Right;15 reps;Supine   Assessment/Plan    PT Assessment Patient needs continued PT services  PT Problem List Decreased  strength;Decreased range of motion;Decreased activity tolerance;Decreased mobility;Decreased knowledge of use of DME;Pain;Decreased knowledge of precautions       PT Treatment Interventions DME instruction;Gait training;Stair training;Functional mobility training;Therapeutic activities;Therapeutic exercise;Patient/family education    PT Goals (Current goals can be found in the Care Plan section)  Acute Rehab PT Goals Patient Stated Goal:  golf PT Goal Formulation: With patient Time For Goal Achievement: 01/21/17 Potential to Achieve Goals: Good    Frequency 7X/week   Barriers to discharge        Co-evaluation               AM-PAC PT "6 Clicks" Daily Activity  Outcome Measure Difficulty turning over in bed (including adjusting bedclothes, sheets and blankets)?: Unable Difficulty moving from lying on back to sitting on the side of the bed? : Unable Difficulty sitting down on and standing up from a chair with arms (e.g., wheelchair, bedside commode, etc,.)?: Unable Help needed moving to and from a bed to chair (including a wheelchair)?: A Little Help needed walking in hospital room?: A Little Help needed climbing 3-5 steps with a railing? : A Little 6 Click Score: 12    End of Session Equipment Utilized During Treatment: Gait belt Activity Tolerance: Patient tolerated treatment well Patient left: in chair;with call bell/phone within reach Nurse Communication: Mobility status PT Visit Diagnosis: Difficulty in walking, not elsewhere classified (R26.2)    Time: 1194-1740 PT Time Calculation (min) (ACUTE ONLY): 39 min   Charges:   PT Evaluation $PT Eval Low Complexity: 1 Low PT Treatments $Gait Training: 8-22 mins $Therapeutic Exercise: 8-22 mins   PT G Codes:        Pg [phone]   Yolande Skoda 01/16/2017, 11:56 AM

## 2017-01-16 NOTE — Evaluation (Signed)
Occupational Therapy Evaluation Patient Details Name: Isaac Powers MRN: [id number] DOB: 1940-05-11 Today's Date: 01/16/2017    History of Present Illness s/p R THA, posterior approach.  H/o TKA   Clinical Impression   This 76 year old man was admitted for the above sx. Will follow in acute setting to reinforce THPs during adls/bathroom transfers.     Follow Up Recommendations  Supervision/Assistance - 24 hour    Equipment Recommendations  None recommended by OT    Recommendations for Other Services       Precautions / Restrictions Precautions Precautions: Posterior Hip;Fall Restrictions Weight Bearing Restrictions: No      Mobility Bed Mobility               General bed mobility comments: oob  Transfers Overall transfer level: Needs assistance Equipment used: Rolling walker (2 wheeled) Transfers: Sit to/from Stand Sit to Stand: Min guard         General transfer comment: cues for UE/LE placement    Balance                                           ADL either performed or assessed with clinical judgement   ADL Overall ADL's : Needs assistance/impaired Eating/Feeding: Independent   Grooming: Set up;Sitting   Upper Body Bathing: Set up;Sitting   Lower Body Bathing: Minimal assistance;With adaptive equipment;Sit to/from stand   Upper Body Dressing : Set up;Sitting   Lower Body Dressing: Moderate assistance;With adaptive equipment;Sit to/from stand   Toilet Transfer: Min guard;Ambulation;BSC;RW   Toileting- Water quality scientist and Hygiene: Min guard;Sit to/from stand         General ADL Comments: educated on posterior THPs and practiced with AE. Needs reinforcement.  Pt's girlfriend also can assist him.  He is interested in Secondary school teacher for home     Vision         Perception     Praxis      Pertinent Vitals/Pain Pain Assessment: Faces Faces Pain Scale: Hurts little more Pain Location: posterior hip, r side Pain  Descriptors / Indicators: Aching Pain Intervention(s): Limited activity within patient's tolerance;Monitored during session;Premedicated before session;Repositioned;Ice applied     Hand Dominance     Extremity/Trunk Assessment Upper Extremity Assessment Upper Extremity Assessment: Overall WFL for tasks assessed           Communication     Cognition Arousal/Alertness: Awake/alert Behavior During Therapy: Impulsive Overall Cognitive Status: Within Functional Limits for tasks assessed                                     General Comments       Exercises     Shoulder Instructions      Home Living Family/patient expects to be discharged to:: Private residence Living Arrangements: Spouse/significant other (girlfriend)                 Bathroom Shower/Tub: Occupational psychologist: Standard     Home Equipment: Bedside commode;Shower seat - built in          Prior Functioning/Environment                   OT Problem List: Decreased knowledge of use of DME or AE;Decreased knowledge of precautions;Pain;Decreased safety awareness      OT  Treatment/Interventions: Self-care/ADL training;DME and/or AE instruction;Therapeutic activities;Patient/family education    OT Goals(Current goals can be found in the care plan section) Acute Rehab OT Goals Patient Stated Goal: golf OT Goal Formulation: With patient Time For Goal Achievement: 01/23/17 Potential to Achieve Goals: Good ADL Goals Pt Will Perform Tub/Shower Transfer: Shower transfer;with min guard assist;ambulating;3 in 1 Additional ADL Goal #1: pt will not need any cues for adl for THPs (either asking for assistance or using AE)  OT Frequency: Min 2X/week   Barriers to D/C:            Co-evaluation              AM-PAC PT "6 Clicks" Daily Activity     Outcome Measure Help from another person eating meals?: None Help from another person taking care of personal grooming?:  A Little Help from another person toileting, which includes using toliet, bedpan, or urinal?: A Little Help from another person bathing (including washing, rinsing, drying)?: A Little Help from another person to put on and taking off regular upper body clothing?: A Little Help from another person to put on and taking off regular lower body clothing?: A Lot 6 Click Score: 18   End of Session    Activity Tolerance: Patient tolerated treatment well Patient left: in chair;with call bell/phone within reach  OT Visit Diagnosis: Pain Pain - Right/Left: Right Pain - part of body: Hip                Time: 6384-6659 OT Time Calculation (min): 23 min Charges:  OT General Charges $OT Visit: 1 Visit OT Evaluation $OT Eval Low Complexity: 1 Low G-Codes:     [place], OTR/L [phone] 01/16/2017  Isaac Powers 01/16/2017, 11:41 AM

## 2017-01-16 NOTE — Progress Notes (Signed)
Discharge planning, spoke with patient and spouse at bedside. Have chosen [facility] for [facility]. Contacted [facility] for referral. Needs RW but has 3n1, contacted [facility] to deliver RW to room. [phone]

## 2017-01-16 NOTE — Progress Notes (Signed)
Physical Therapy Treatment Patient Details Name: Isaac Powers MRN: [id number] DOB: 24-Jun-1940 Today's Date: 01/16/2017    History of Present Illness s/p R THA, posterior approach.  H/o TKA    PT Comments    Pt very motivated, progressing well with mobility and hopeful for dc home tomorrow.   Follow Up Recommendations  Home health PT     Equipment Recommendations  Rolling walker with 5" wheels    Recommendations for Other Services OT consult     Precautions / Restrictions Precautions Precautions: Posterior Hip;Fall Precaution Comments: Reviewed hip precautions x 2 Restrictions Weight Bearing Restrictions: No Other Position/Activity Restrictions: WBAT    Mobility  Bed Mobility Overal bed mobility: Needs Assistance Bed Mobility: Sit to Supine       Sit to supine: Min guard   General bed mobility comments: Cues for sequence, adherence to THP and use of L LE to self assist  Transfers Overall transfer level: Needs assistance Equipment used: Rolling walker (2 wheeled) Transfers: Sit to/from Stand Sit to Stand: Min guard         General transfer comment: cues for UE/LE placement  Ambulation/Gait Ambulation/Gait assistance: Min guard Ambulation Distance (Feet): 200 Feet Assistive device: Rolling walker (2 wheeled) Gait Pattern/deviations: Step-to pattern;Step-through pattern;Decreased step length - right;Decreased step length - left;Shuffle;Trunk flexed Gait velocity: decr Gait velocity interpretation: Below normal speed for age/gender General Gait Details: cues for sequence, posture, position from RW and ER on R   Stairs            Wheelchair Mobility    Modified Rankin (Stroke Patients Only)       Balance Overall balance assessment: No apparent balance deficits (not formally assessed)                                          Cognition Arousal/Alertness: Awake/alert Behavior During Therapy: Impulsive Overall Cognitive  Status: Within Functional Limits for tasks assessed                                        Exercises Total Joint Exercises Ankle Circles/Pumps: AROM;Both;15 reps;Supine Quad Sets: AROM;Both;10 reps;Supine Heel Slides: AAROM;Right;20 reps;Supine Hip ABduction/ADduction: AAROM;Right;15 reps;Supine    General Comments        Pertinent Vitals/Pain Pain Assessment: 0-10 Pain Score: 3  Pain Location: posterior hip, r side Pain Descriptors / Indicators: Aching Pain Intervention(s): Limited activity within patient's tolerance;Monitored during session;Premedicated before session    Home Living                      Prior Function            PT Goals (current goals can now be found in the care plan section) Acute Rehab PT Goals Patient Stated Goal: golf PT Goal Formulation: With patient Time For Goal Achievement: 01/21/17 Potential to Achieve Goals: Good Progress towards PT goals: Progressing toward goals    Frequency    7X/week      PT Plan Current plan remains appropriate    Co-evaluation              AM-PAC PT "6 Clicks" Daily Activity  Outcome Measure  Difficulty turning over in bed (including adjusting bedclothes, sheets and blankets)?: Unable Difficulty moving from lying on back to sitting on the  side of the bed? : A Lot Difficulty sitting down on and standing up from a chair with arms (e.g., wheelchair, bedside commode, etc,.)?: A Lot Help needed moving to and from a bed to chair (including a wheelchair)?: A Little Help needed walking in hospital room?: A Little Help needed climbing 3-5 steps with a railing? : A Little 6 Click Score: 14    End of Session Equipment Utilized During Treatment: Gait belt Activity Tolerance: Patient tolerated treatment well Patient left: in bed;with call bell/phone within reach;with family/visitor present Nurse Communication: Mobility status PT Visit Diagnosis: Difficulty in walking, not elsewhere  classified (R26.2)     Time: 1349-1420 PT Time Calculation (min) (ACUTE ONLY): 31 min  Charges:  $Gait Training: 8-22 mins $Therapeutic Exercise: 8-22 mins                    G Codes:       Pg [phone]    Lovely Kerins 01/16/2017, 3:10 PM
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
## 2017-01-17 LAB — BASIC METABOLIC PANEL
Anion gap: 8 (ref 5–15)
BUN: 18 mg/dL (ref 6–20)
CHLORIDE: 104 mmol/L (ref 101–111)
CO2: 25 mmol/L (ref 22–32)
CREATININE: 0.9 mg/dL (ref 0.61–1.24)
Calcium: 8.7 mg/dL — ABNORMAL LOW (ref 8.9–10.3)
GFR calc non Af Amer: >60 mL/min (ref >60)
GLUCOSE: 123 mg/dL — AB (ref 65–99)
Potassium: 4 mmol/L (ref 3.5–5.1)
Sodium: 137 mmol/L (ref 135–145)

## 2017-01-17 LAB — CBC
HEMATOCRIT: 32.2 % — AB (ref 39.0–52.0)
HEMOGLOBIN: 11 g/dL — AB (ref 13.0–17.0)
MCH: 32.2 pg (ref 26.0–34.0)
MCHC: 34.2 g/dL (ref 30.0–36.0)
MCV: 94.2 fL (ref 78.0–100.0)
Platelets: 206 10*3/uL (ref 150–400)
RBC: 3.42 MIL/uL — ABNORMAL LOW (ref 4.22–5.81)
RDW: 14.5 % (ref 11.5–15.5)
WBC: 13.8 10*3/uL — ABNORMAL HIGH (ref 4.0–10.5)

## 2017-01-17 MED ORDER — TRAMADOL HCL 50 MG PO TABS: 50.0000 mg | ORAL_TABLET | Freq: Four times a day (QID) | ORAL | 0 refills | Status: DC | PRN

## 2017-01-17 MED ORDER — OXYCODONE HCL 5 MG PO TABS: 5.0000 mg | ORAL_TABLET | ORAL | 0 refills | Status: DC | PRN

## 2017-01-17 MED ORDER — RIVAROXABAN 10 MG PO TABS: 10.0000 mg | ORAL_TABLET | Freq: Every day | ORAL | 0 refills | Status: DC

## 2017-01-17 MED ORDER — METHOCARBAMOL 500 MG PO TABS: 500.0000 mg | ORAL_TABLET | Freq: Four times a day (QID) | ORAL | 0 refills | Status: DC | PRN
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
## 2017-01-17 NOTE — Progress Notes (Signed)
Physical Therapy Treatment Patient Details Name: Isaac Powers MRN: [id number] DOB: [birth year] Today's Date: 01/17/2017    History of Present Illness s/p R THA, posterior approach.  H/o TKA    PT Comments    Pt very motivated and eager for dc home.  Reviewed therex, stairs and car transfers.   Follow Up Recommendations  Home health PT     Equipment Recommendations  Rolling walker with 5" wheels    Recommendations for Other Services OT consult     Precautions / Restrictions Precautions Precautions: Posterior Hip;Fall Precaution Booklet Issued: Yes (comment) Precaution Comments: Pt recalls 2/3 THP without cues.  Reviewed all Restrictions Weight Bearing Restrictions: No Other Position/Activity Restrictions: WBAT    Mobility  Bed Mobility Overal bed mobility: Needs Assistance Bed Mobility: Supine to Sit;Sit to Supine     Supine to sit: Supervision Sit to supine: Min guard   General bed mobility comments: observed thps  Transfers Overall transfer level: Needs assistance Equipment used: Rolling walker (2 wheeled) Transfers: Sit to/from Stand Sit to Stand: Min guard;Supervision         General transfer comment: cues for UE/LE placement  Ambulation/Gait Ambulation/Gait assistance: Min guard;Supervision Ambulation Distance (Feet): 240 Feet Assistive device: Rolling walker (2 wheeled) Gait Pattern/deviations: Step-to pattern;Step-through pattern;Decreased step length - right;Decreased step length - left;Shuffle;Trunk flexed Gait velocity: decr Gait velocity interpretation: Below normal speed for age/gender General Gait Details: cues for sequence, posture, position from RW and ER on R   Stairs Stairs: Yes   Stair Management: One rail Right;Step to pattern;Forwards;With crutches Number of Stairs: 4 General stair comments: min cues for seqeunce and foot/crutch placement  Wheelchair Mobility    Modified Rankin (Stroke Patients Only)       Balance  Overall balance assessment: No apparent balance deficits (not formally assessed)                                          Cognition Arousal/Alertness: Awake/alert Behavior During Therapy: Impulsive Overall Cognitive Status: Within Functional Limits for tasks assessed                                        Exercises Total Joint Exercises Ankle Circles/Pumps: AROM;Both;15 reps;Supine Quad Sets: AROM;Both;10 reps;Supine Heel Slides: AAROM;Right;20 reps;Supine Hip ABduction/ADduction: AAROM;Right;15 reps;Supine Long Arc Quad: AROM;Right;15 reps;Supine    General Comments        Pertinent Vitals/Pain Pain Assessment: 0-10 Pain Score: 4  Pain Location: posterior hip, r side Pain Descriptors / Indicators: Aching Pain Intervention(s): Limited activity within patient's tolerance;Monitored during session;Premedicated before session;Ice applied    Home Living                      Prior Function            PT Goals (current goals can now be found in the care plan section) Acute Rehab PT Goals Patient Stated Goal: golf PT Goal Formulation: With patient Time For Goal Achievement: 01/21/17 Potential to Achieve Goals: Good Progress towards PT goals: Progressing toward goals    Frequency    7X/week      PT Plan Current plan remains appropriate    Co-evaluation              AM-PAC PT "6 Clicks" Daily Activity  Outcome Measure  Difficulty turning over in bed (including adjusting bedclothes, sheets and blankets)?: A Lot Difficulty moving from lying on back to sitting on the side of the bed? : A Lot Difficulty sitting down on and standing up from a chair with arms (e.g., wheelchair, bedside commode, etc,.)?: A Lot Help needed moving to and from a bed to chair (including a wheelchair)?: A Little Help needed walking in hospital room?: A Little Help needed climbing 3-5 steps with a railing? : A Little 6 Click Score: 15    End  of Session Equipment Utilized During Treatment: Gait belt Activity Tolerance: Patient tolerated treatment well Patient left: in bed;with call bell/phone within reach;with family/visitor present Nurse Communication: Mobility status PT Visit Diagnosis: Difficulty in walking, not elsewhere classified (R26.2)     Time: 6834-1962 PT Time Calculation (min) (ACUTE ONLY): 41 min  Charges:  $Gait Training: 8-22 mins $Therapeutic Exercise: 8-22 mins $Therapeutic Activity: 8-22 mins                    G Codes:       Pg [phone]    Taraoluwa Thakur 01/17/2017, 12:47 PM

## 2017-01-17 NOTE — Progress Notes (Signed)
Occupational Therapy Treatment Patient Details Name: Isaac Powers MRN: [id number] DOB: 1940-08-08 Today's Date: 01/17/2017    History of present illness s/p R THA, posterior approach.  H/o TKA   OT comments  Pt able to perform bathroom transfers at min guard level.  Moves quickly and need reinforcement for no internal rotation  Follow Up Recommendations  Supervision/Assistance - 24 hour    Equipment Recommendations  None recommended by OT    Recommendations for Other Services      Precautions / Restrictions Precautions Precautions: Posterior Hip;Fall Precaution Booklet Issued: Yes (comment) Precaution Comments: Reviewed hip precautions x 2 Restrictions Weight Bearing Restrictions: No Other Position/Activity Restrictions: WBAT       Mobility Bed Mobility         Supine to sit: Supervision     General bed mobility comments: observed thps  Transfers   Equipment used: Rolling walker (2 wheeled)   Sit to Stand: Min guard         General transfer comment: cues for UE/LE placement    Balance                                           ADL either performed or assessed with clinical judgement   ADL                           Toilet Transfer: Min guard;Ambulation;BSC;RW   Toileting- Water quality scientist and Hygiene: Min guard;Sit to/from stand   Tub/ Shower Transfer: Walk-in shower;Min guard;Ambulation;Rolling walker     General ADL Comments: cues for thps. Pt tends to move quickly and turn quickly. Cues were for no internal rotation     Vision       Perception     Praxis      Cognition Arousal/Alertness: Awake/alert Behavior During Therapy: Impulsive Overall Cognitive Status: Within Functional Limits for tasks assessed                                          Exercises     Shoulder Instructions       General Comments      Pertinent Vitals/ Pain       Pain Score: 3  Pain Location:  posterior hip, r side Pain Descriptors / Indicators: Aching Pain Intervention(s): Limited activity within patient's tolerance;Monitored during session;Premedicated before session;Repositioned;Ice applied  Home Living                                          Prior Functioning/Environment              Frequency           Progress Toward Goals  OT Goals(current goals can now be found in the care plan section)  Progress towards OT goals: Progressing toward goals (goals met; pt needs reinforcement with THPs)  Acute Rehab OT Goals Time For Goal Achievement: 01/23/17  Plan      Co-evaluation                 AM-PAC PT "6 Clicks" Daily Activity     Outcome Measure   Help from another person eating meals?: None  Help from another person taking care of personal grooming?: A Little Help from another person toileting, which includes using toliet, bedpan, or urinal?: A Little Help from another person bathing (including washing, rinsing, drying)?: A Little Help from another person to put on and taking off regular upper body clothing?: A Little Help from another person to put on and taking off regular lower body clothing?: A Lot 6 Click Score: 18    End of Session    OT Visit Diagnosis: Pain Pain - Right/Left: Right Pain - part of body: Hip   Activity Tolerance Patient tolerated treatment well   Patient Left in chair;with call bell/phone within reach   Nurse Communication          Time: 2595-6387 OT Time Calculation (min): 15 min  Charges: OT General Charges $OT Visit: 1 Visit OT Treatments $Self Care/Home Management : 8-22 mins  Lesle Chris, OTR/L [phone] 01/17/2017   Isaac Powers 01/17/2017, 10:19 AM
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
## 2017-01-18 DIAGNOSIS — I739 Peripheral vascular disease, unspecified: Secondary | ICD-10-CM | POA: Diagnosis not present

## 2017-01-18 DIAGNOSIS — Z7901 Long term (current) use of anticoagulants: Secondary | ICD-10-CM | POA: Diagnosis not present

## 2017-01-18 DIAGNOSIS — Z471 Aftercare following joint replacement surgery: Secondary | ICD-10-CM | POA: Diagnosis not present

## 2017-01-18 DIAGNOSIS — M1990 Unspecified osteoarthritis, unspecified site: Secondary | ICD-10-CM | POA: Diagnosis not present

## 2017-01-18 DIAGNOSIS — Z96641 Presence of right artificial hip joint: Secondary | ICD-10-CM | POA: Diagnosis not present

## 2017-01-18 DIAGNOSIS — Z9181 History of falling: Secondary | ICD-10-CM | POA: Diagnosis not present

## 2017-02-20 DIAGNOSIS — M1651 Unilateral post-traumatic osteoarthritis, right hip: Secondary | ICD-10-CM | POA: Diagnosis not present

## 2017-08-05 DIAGNOSIS — Z8546 Personal history of malignant neoplasm of prostate: Secondary | ICD-10-CM | POA: Diagnosis not present

## 2017-08-12 DIAGNOSIS — N5231 Erectile dysfunction following radical prostatectomy: Secondary | ICD-10-CM | POA: Diagnosis not present

## 2017-08-12 DIAGNOSIS — Z8546 Personal history of malignant neoplasm of prostate: Secondary | ICD-10-CM | POA: Diagnosis not present

## 2017-08-12 DIAGNOSIS — N393 Stress incontinence (female) (male): Secondary | ICD-10-CM | POA: Diagnosis not present

## 2017-12-04 DIAGNOSIS — H524 Presbyopia: Secondary | ICD-10-CM | POA: Diagnosis not present

## 2017-12-04 DIAGNOSIS — H25813 Combined forms of age-related cataract, bilateral: Secondary | ICD-10-CM | POA: Diagnosis not present

## 2017-12-04 LAB — HM DIABETES EYE EXAM

## 2017-12-23 ENCOUNTER — Encounter: Payer: Self-pay | Admitting: Internal Medicine

## 2017-12-23 ENCOUNTER — Ambulatory Visit: Payer: Medicare Other | Admitting: Internal Medicine

## 2017-12-23 VITALS — BP 140/82 | HR 70 | Temp 97.8°F | Ht 70.0 in | Wt 210.0 lb

## 2017-12-23 DIAGNOSIS — Z23 Encounter for immunization: Secondary | ICD-10-CM | POA: Diagnosis not present

## 2017-12-23 DIAGNOSIS — H44812 Hemophthalmos, left eye: Secondary | ICD-10-CM | POA: Diagnosis not present
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
## 2017-12-23 NOTE — Assessment & Plan Note (Signed)
Not sure where the hemorrhage was or if venous or arterial Patient notes bad reaction with headache to sildenafil---certainly possible that this is related to that BP is not bad--borderline BP Readings from Last 3 Encounters:  12/23/17 140/82  01/17/17 124/80  01/09/17 (!) 163/97   Discussed meds for this---he wishes to hold off Will not take sildenafil any more Will try to get records from Dr Gloriann Loan

## 2017-12-28 IMAGING — NM NM BONE WHOLE BODY
2 series · 2 of 2 positions shown · non-contrast
Comparison: Abdominal CT 05/15/2016

CLINICAL DATA: Prostate cancer.  PSA of 9

EXAM:
NUCLEAR MEDICINE WHOLE BODY BONE SCAN
TECHNIQUE: Whole body anterior and posterior images were obtained approximately
3 hours after intravenous injection of radiopharmaceutical.
RADIOPHARMACEUTICALS:  20.5 mCi Nechnetium-OOm MDP IV

[Series 1: whole body · 2.66mm/px · 1 of 1 slices shown (1 of 2)]
[im 1/1]
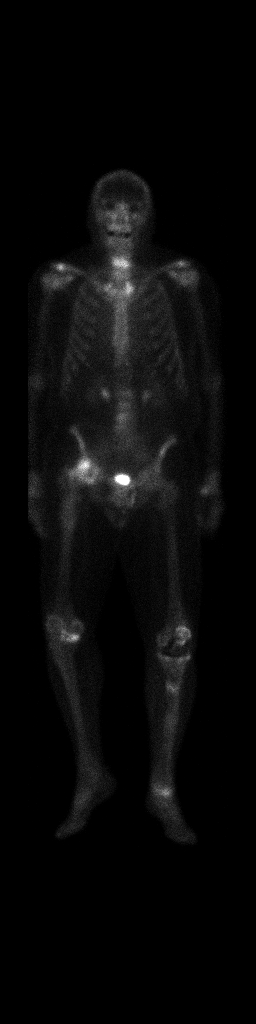

[Series 1: whole body · 2.66mm/px · 1 of 1 slices shown (2 of 2)]
[im 1/1]
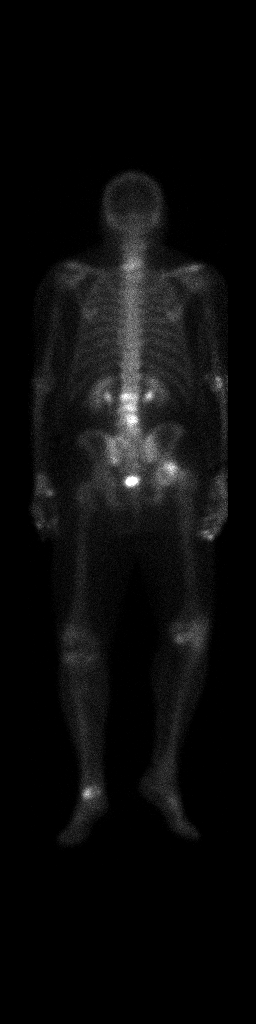

[2 of 2 positions shown; findings below may reference images not displayed]

FINDINGS: Symmetric renal activity and bladder activity is noted.

3 level lumbar activity correlating with advanced disc degeneration
causing sclerosis on comparison CT. Activity over the lower cervical
spine, greater anteriorly. Suspect this is also degenerative.

Abnormal asymmetric activity at the right hip and proximal femur,
where there is posttraumatic deformity and advanced osteoarthritis
by CT. Activity about a left knee prosthesis and degenerated medial
compartment right knee.

Overall, no definitive bony metastasis.
IMPRESSION: 1. No definite metastatic pattern.
2. Prominent activity at the lower cervical spine is likely
degenerative, suggest confirmatory radiographic or CT correlation.
3. Degenerative and posttraumatic type activity about the knees,
right hip, and lumbar spine.

## 2018-01-08 DIAGNOSIS — Z96641 Presence of right artificial hip joint: Secondary | ICD-10-CM | POA: Diagnosis not present

## 2018-01-20 DIAGNOSIS — Z8546 Personal history of malignant neoplasm of prostate: Secondary | ICD-10-CM | POA: Diagnosis not present

## 2018-01-27 DIAGNOSIS — Z8546 Personal history of malignant neoplasm of prostate: Secondary | ICD-10-CM | POA: Diagnosis not present

## 2018-02-04 DIAGNOSIS — Z96652 Presence of left artificial knee joint: Secondary | ICD-10-CM | POA: Diagnosis not present

## 2018-02-04 DIAGNOSIS — Z471 Aftercare following joint replacement surgery: Secondary | ICD-10-CM | POA: Diagnosis not present

## 2018-03-23 NOTE — Addendum Note (Signed)
Addended by: Pilar Grammes on: 03/23/2018 12:58 PM   Modules accepted: Orders

## 2018-06-04 DIAGNOSIS — H25813 Combined forms of age-related cataract, bilateral: Secondary | ICD-10-CM | POA: Diagnosis not present

## 2018-06-07 IMAGING — DX DG HIP (WITH OR WITHOUT PELVIS) 1V PORT*R*
1 series · 1 of 1 positions shown · non-contrast
Comparison: None.

CLINICAL DATA: Intraoperative radiograph requested by surgeon for
distal prosthesis assessment.

EXAM:
DG HIP (WITH OR WITHOUT PELVIS) 1V PORT RIGHT

[hip ap]
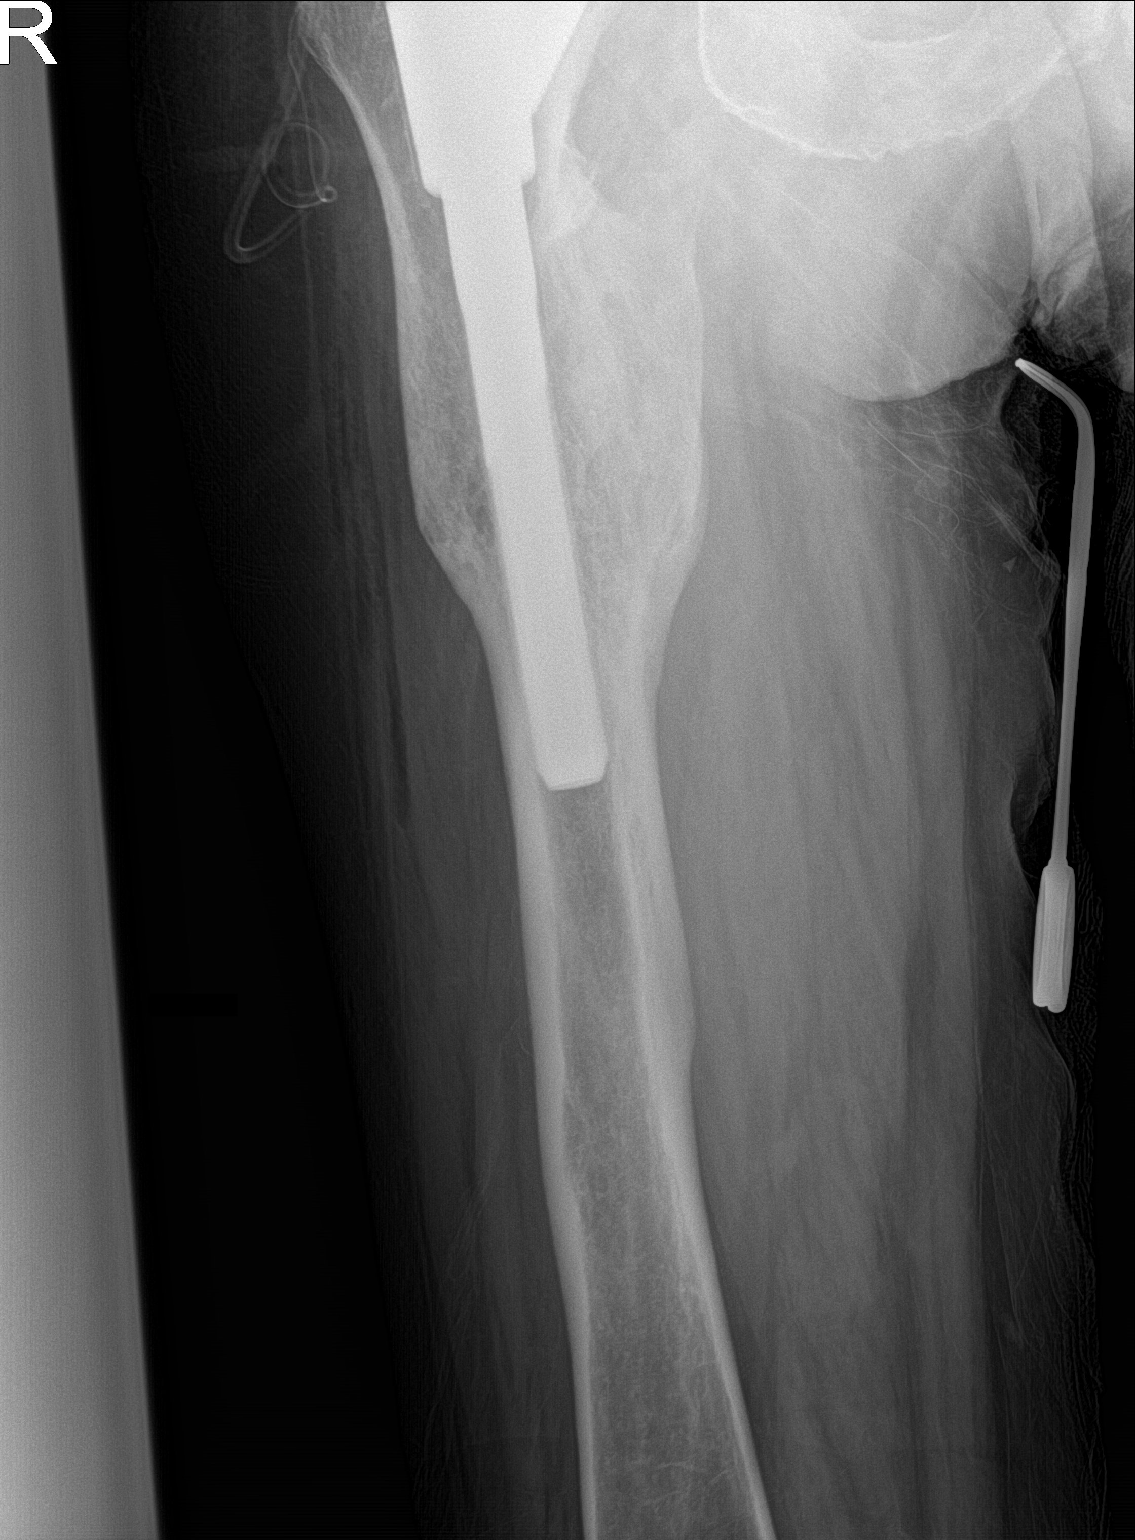

[1 of 1 positions shown; findings below may reference images not displayed]

FINDINGS: There is partially visualized RIGHT total hip arthroplasty. Previous
RIGHT femur fracture has healed with slight angulation. This
deformity has not prevented passage of the standard femoral
component stem into the marrow cavity of the proximal femur.
Satisfactory position and alignment.
IMPRESSION: As above.

## 2018-06-07 IMAGING — DX DG PORTABLE PELVIS
1 series · 1 of 1 positions shown · non-contrast
Comparison: Abdomen and pelvis CT dated 05/15/2016.

CLINICAL DATA: Status post right hip arthroplasty.

EXAM:
PORTABLE PELVIS 1-2 VIEWS

[pelvis ap]
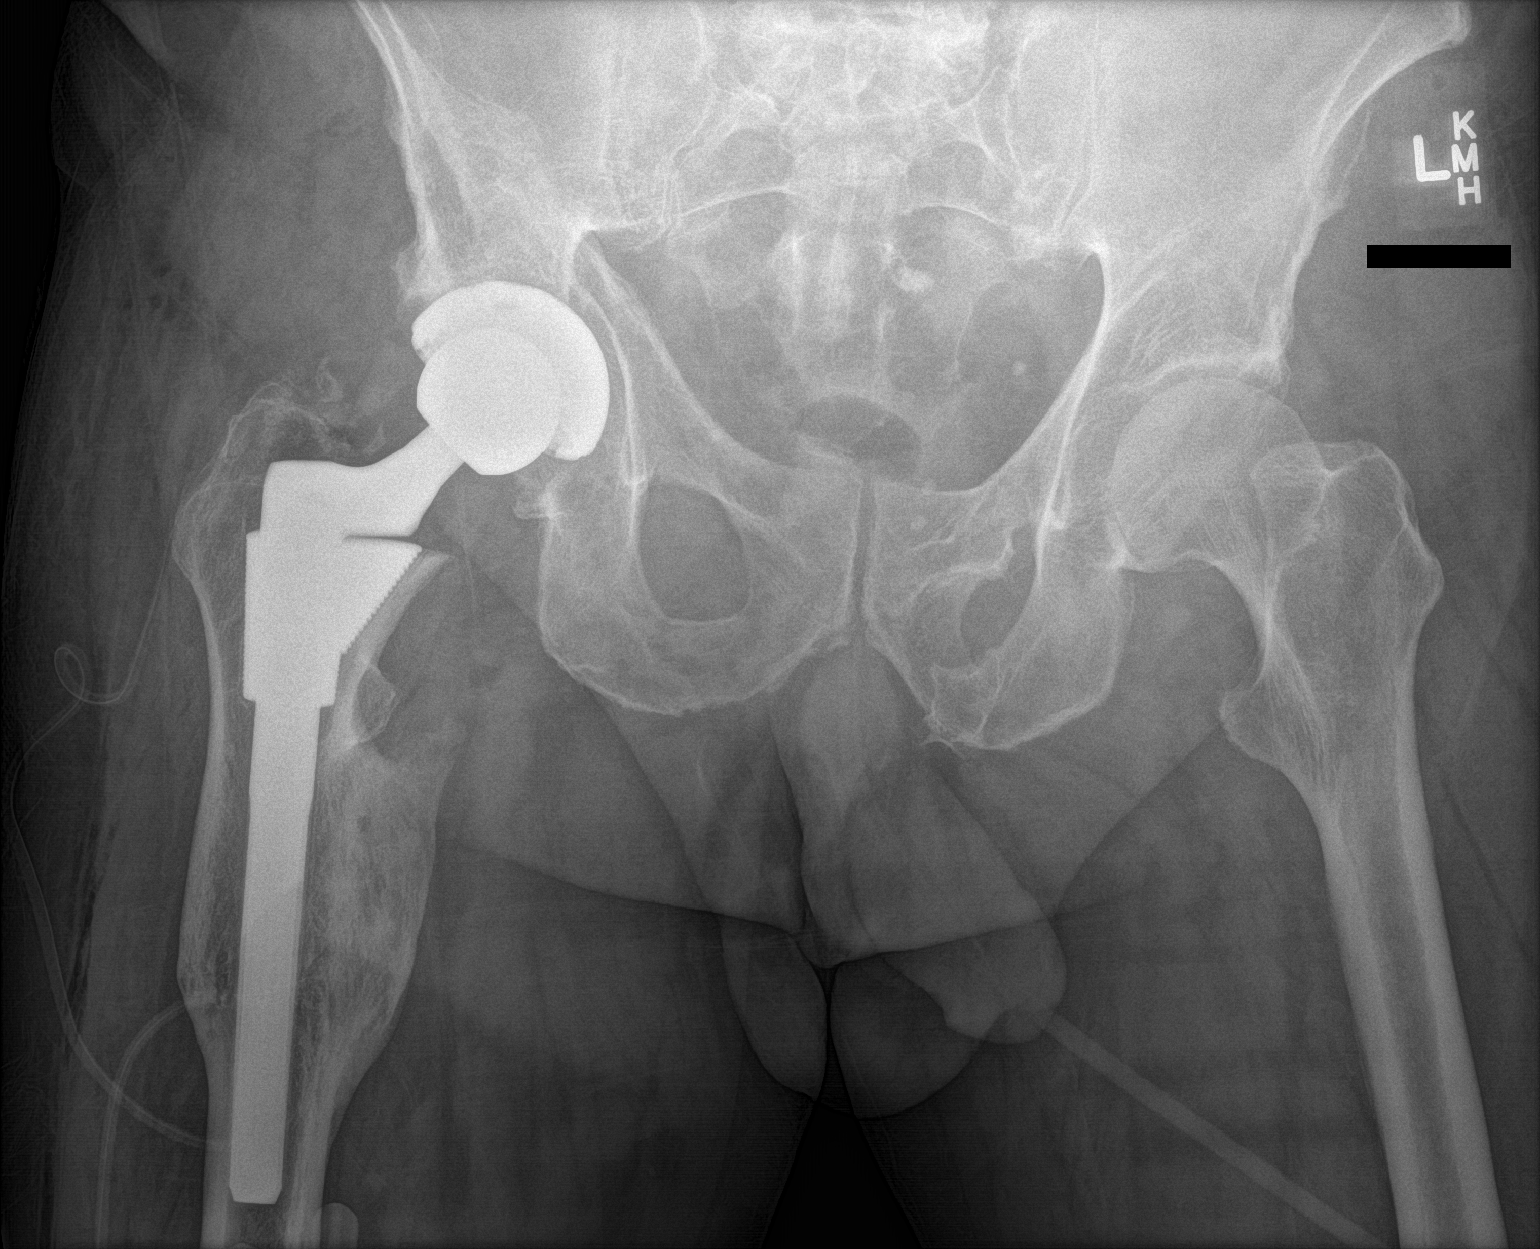

[1 of 1 positions shown; findings below may reference images not displayed]

FINDINGS: Interval right hip bipolar prosthesis in satisfactory position and
alignment. Old, healed proximal right femur fracture. Old, healed
left superior and inferior pubic ramus fractures. No acute fracture
or dislocation.
IMPRESSION: Satisfactory postoperative appearance of a right hip prosthesis.

## 2018-06-23 ENCOUNTER — Encounter: Payer: Self-pay | Admitting: Internal Medicine

## 2018-06-30 ENCOUNTER — Ambulatory Visit (INDEPENDENT_AMBULATORY_CARE_PROVIDER_SITE_OTHER): Payer: Medicare Other | Admitting: Internal Medicine

## 2018-06-30 ENCOUNTER — Other Ambulatory Visit: Payer: Self-pay

## 2018-06-30 ENCOUNTER — Encounter: Payer: Self-pay | Admitting: Internal Medicine

## 2018-06-30 VITALS — Ht 70.0 in | Wt 205.0 lb

## 2018-06-30 DIAGNOSIS — H9319 Tinnitus, unspecified ear: Secondary | ICD-10-CM | POA: Diagnosis not present

## 2018-06-30 DIAGNOSIS — C61 Malignant neoplasm of prostate: Secondary | ICD-10-CM | POA: Diagnosis not present

## 2018-06-30 DIAGNOSIS — L309 Dermatitis, unspecified: Secondary | ICD-10-CM | POA: Diagnosis not present

## 2018-06-30 MED ORDER — DOXYCYCLINE HYCLATE 100 MG PO TABS: 100.0000 mg | ORAL_TABLET | Freq: Two times a day (BID) | ORAL | 1 refills | Status: DC

## 2018-06-30 NOTE — Assessment & Plan Note (Signed)
Mild\ Discussed that this is not related to his prostate surgery Observe only

## 2018-06-30 NOTE — Assessment & Plan Note (Signed)
Looks like it may be infectious---will try course of doxy
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
## 2018-06-30 NOTE — Assessment & Plan Note (Signed)
Had invasive disease at surgery Will likely need therapy in the near future Will set up recheck with Dr Louis Meckel

## 2018-12-15 ENCOUNTER — Encounter: Payer: Self-pay | Admitting: Internal Medicine

## 2018-12-15 ENCOUNTER — Ambulatory Visit (INDEPENDENT_AMBULATORY_CARE_PROVIDER_SITE_OTHER): Payer: Medicare Other | Admitting: Internal Medicine

## 2018-12-15 ENCOUNTER — Other Ambulatory Visit: Payer: Self-pay

## 2018-12-15 VITALS — BP 128/84 | HR 66 | Temp 98.3°F | Ht 68.5 in | Wt 209.0 lb

## 2018-12-15 DIAGNOSIS — R7301 Impaired fasting glucose: Secondary | ICD-10-CM

## 2018-12-15 DIAGNOSIS — L408 Other psoriasis: Secondary | ICD-10-CM | POA: Diagnosis not present

## 2018-12-15 DIAGNOSIS — Z0000 Encounter for general adult medical examination without abnormal findings: Secondary | ICD-10-CM

## 2018-12-15 DIAGNOSIS — E785 Hyperlipidemia, unspecified: Secondary | ICD-10-CM | POA: Diagnosis not present

## 2018-12-15 DIAGNOSIS — M8949 Other hypertrophic osteoarthropathy, multiple sites: Secondary | ICD-10-CM

## 2018-12-15 DIAGNOSIS — Z8546 Personal history of malignant neoplasm of prostate: Secondary | ICD-10-CM | POA: Diagnosis not present

## 2018-12-15 DIAGNOSIS — Z23 Encounter for immunization: Secondary | ICD-10-CM

## 2018-12-15 DIAGNOSIS — M150 Primary generalized (osteo)arthritis: Secondary | ICD-10-CM | POA: Diagnosis not present

## 2018-12-15 DIAGNOSIS — M159 Polyosteoarthritis, unspecified: Secondary | ICD-10-CM

## 2018-12-15 DIAGNOSIS — Z7189 Other specified counseling: Secondary | ICD-10-CM

## 2018-12-15 LAB — CBC
HCT: 45.5 % (ref 39.0–52.0)
Hemoglobin: 15.6 g/dL (ref 13.0–17.0)
MCHC: 34.3 g/dL (ref 30.0–36.0)
MCV: 99.5 fl (ref 78.0–100.0)
Platelets: 255 10*3/uL (ref 150.0–400.0)
RBC: 4.57 Mil/uL (ref 4.22–5.81)
RDW: 13.3 % (ref 11.5–15.5)
WBC: 5.9 10*3/uL (ref 4.0–10.5)

## 2018-12-15 LAB — LIPID PANEL
Cholesterol: 262 mg/dL — ABNORMAL HIGH (ref 0–200)
HDL: 46.3 mg/dL (ref >39.00)
LDL Cholesterol: 176 mg/dL — ABNORMAL HIGH (ref 0–99)
NonHDL: 215.33
Total CHOL/HDL Ratio: 6
Triglycerides: 198 mg/dL — ABNORMAL HIGH (ref 0.0–149.0)
VLDL: 39.6 mg/dL (ref 0.0–40.0)

## 2018-12-15 LAB — COMPREHENSIVE METABOLIC PANEL
ALT: 19 U/L (ref 0–53)
AST: 21 U/L (ref 0–37)
Albumin: 4.5 g/dL (ref 3.5–5.2)
Alkaline Phosphatase: 91 U/L (ref 39–117)
BUN: 22 mg/dL (ref 6–23)
CO2: 30 mEq/L (ref 19–32)
Calcium: 10.1 mg/dL (ref 8.4–10.5)
Chloride: 101 mEq/L (ref 96–112)
Creatinine, Ser: 1.04 mg/dL (ref 0.40–1.50)
GFR: 69.31 mL/min (ref >60.00)
Glucose, Bld: 85 mg/dL (ref 70–99)
Potassium: 4.9 mEq/L (ref 3.5–5.1)
Sodium: 137 mEq/L (ref 135–145)
Total Bilirubin: 0.8 mg/dL (ref 0.2–1.2)
Total Protein: 7.2 g/dL (ref 6.0–8.3)

## 2018-12-15 LAB — PSA: PSA: 0 ng/mL — ABNORMAL LOW (ref 0.10–4.00)

## 2018-12-15 LAB — HEMOGLOBIN A1C: Hgb A1c MFr Bld: 5.5 % (ref 4.6–6.5)

## 2018-12-15 NOTE — Assessment & Plan Note (Addendum)
Mostly in hands---worsens with the golf Discussed topical, tylenol He finds advill helps (not not often)

## 2018-12-15 NOTE — Assessment & Plan Note (Signed)
Fairly healthy Discussed fitness--especially resistance training Flu vaccine today Due for Td---he will come in if he has any injury Recommended shingrix at pharmacy Due for colon --will have it set up

## 2018-12-15 NOTE — Assessment & Plan Note (Signed)
Intermittent elevated levels Will recheck

## 2018-12-15 NOTE — Assessment & Plan Note (Signed)
Hasn't seen Dr Louis Meckel in a while Will recheck PSA

## 2018-12-15 NOTE — Assessment & Plan Note (Signed)
He is not excited about medications Will recheck

## 2018-12-15 NOTE — Progress Notes (Signed)
Hearing Screening   Method: Audiometry   125Hz  250Hz  500Hz  1000Hz  2000Hz  3000Hz  4000Hz  6000Hz  8000Hz   Right ear:   20 20 20  20     Left ear:   20 20 20  20     Vision Screening Comments: March 2020
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
## 2018-12-15 NOTE — Assessment & Plan Note (Signed)
See social history 

## 2018-12-15 NOTE — Addendum Note (Signed)
Addended by: Pilar Grammes on: 12/15/2018 01:55 PM   Modules accepted: Orders

## 2018-12-15 NOTE — Assessment & Plan Note (Signed)
Mild He just uses moisturizers

## 2018-12-26 DIAGNOSIS — M722 Plantar fascial fibromatosis: Secondary | ICD-10-CM | POA: Diagnosis not present

## 2019-10-05 DIAGNOSIS — H16223 Keratoconjunctivitis sicca, not specified as Sjogren's, bilateral: Secondary | ICD-10-CM | POA: Diagnosis not present

## 2019-11-18 DIAGNOSIS — H25813 Combined forms of age-related cataract, bilateral: Secondary | ICD-10-CM | POA: Diagnosis not present

## 2021-01-02 DIAGNOSIS — H524 Presbyopia: Secondary | ICD-10-CM | POA: Diagnosis not present

## 2021-01-02 DIAGNOSIS — H25813 Combined forms of age-related cataract, bilateral: Secondary | ICD-10-CM | POA: Diagnosis not present

## 2021-05-25 DIAGNOSIS — L820 Inflamed seborrheic keratosis: Secondary | ICD-10-CM | POA: Diagnosis not present

## 2021-05-25 DIAGNOSIS — L400 Psoriasis vulgaris: Secondary | ICD-10-CM | POA: Diagnosis not present

## 2021-05-25 DIAGNOSIS — D2261 Melanocytic nevi of right upper limb, including shoulder: Secondary | ICD-10-CM | POA: Diagnosis not present

## 2021-05-25 DIAGNOSIS — D2262 Melanocytic nevi of left upper limb, including shoulder: Secondary | ICD-10-CM | POA: Diagnosis not present

## 2021-05-25 DIAGNOSIS — D225 Melanocytic nevi of trunk: Secondary | ICD-10-CM | POA: Diagnosis not present

## 2021-11-16 ENCOUNTER — Telehealth: Payer: Self-pay | Admitting: *Deleted

## 2021-11-16 NOTE — Patient Outreach (Signed)
  Care Coordination   11/16/2021 Name: Isaac Powers MRN: [id number] DOB: 25-Nov-1940   Care Coordination Outreach Attempts:  An unsuccessful telephone outreach was attempted today to offer the patient information about available care coordination services as a benefit of their health plan.   Follow Up Plan:  Additional outreach attempts will be made to offer the patient care coordination information and services.   Encounter Outcome:  No Answer  Care Coordination Interventions Activated:  No   Care Coordination Interventions:  No, not indicated    [place]unetta Marina, [place] Worker  [facility] Care Management [phone]

## 2022-01-15 DIAGNOSIS — H524 Presbyopia: Secondary | ICD-10-CM | POA: Diagnosis not present

## 2022-01-15 DIAGNOSIS — H25813 Combined forms of age-related cataract, bilateral: Secondary | ICD-10-CM | POA: Diagnosis not present

## 2022-05-09 DIAGNOSIS — H35371 Puckering of macula, right eye: Secondary | ICD-10-CM | POA: Diagnosis not present

## 2022-05-09 DIAGNOSIS — H18413 Arcus senilis, bilateral: Secondary | ICD-10-CM | POA: Diagnosis not present

## 2022-05-09 DIAGNOSIS — H25013 Cortical age-related cataract, bilateral: Secondary | ICD-10-CM | POA: Diagnosis not present

## 2022-05-09 DIAGNOSIS — H2511 Age-related nuclear cataract, right eye: Secondary | ICD-10-CM | POA: Diagnosis not present

## 2022-05-09 DIAGNOSIS — H2513 Age-related nuclear cataract, bilateral: Secondary | ICD-10-CM | POA: Diagnosis not present

## 2022-05-09 DIAGNOSIS — H25043 Posterior subcapsular polar age-related cataract, bilateral: Secondary | ICD-10-CM | POA: Diagnosis not present

## 2022-05-28 DIAGNOSIS — L570 Actinic keratosis: Secondary | ICD-10-CM | POA: Diagnosis not present

## 2022-05-28 DIAGNOSIS — D225 Melanocytic nevi of trunk: Secondary | ICD-10-CM | POA: Diagnosis not present

## 2022-05-28 DIAGNOSIS — D2272 Melanocytic nevi of left lower limb, including hip: Secondary | ICD-10-CM | POA: Diagnosis not present

## 2022-05-28 DIAGNOSIS — D2261 Melanocytic nevi of right upper limb, including shoulder: Secondary | ICD-10-CM | POA: Diagnosis not present

## 2022-05-28 DIAGNOSIS — D485 Neoplasm of uncertain behavior of skin: Secondary | ICD-10-CM | POA: Diagnosis not present

## 2022-05-28 DIAGNOSIS — L400 Psoriasis vulgaris: Secondary | ICD-10-CM | POA: Diagnosis not present

## 2022-05-28 DIAGNOSIS — D2262 Melanocytic nevi of left upper limb, including shoulder: Secondary | ICD-10-CM | POA: Diagnosis not present

## 2022-07-10 DIAGNOSIS — H11043 Peripheral pterygium, stationary, bilateral: Secondary | ICD-10-CM | POA: Diagnosis not present

## 2022-07-10 DIAGNOSIS — H25043 Posterior subcapsular polar age-related cataract, bilateral: Secondary | ICD-10-CM | POA: Diagnosis not present

## 2022-07-10 DIAGNOSIS — H35371 Puckering of macula, right eye: Secondary | ICD-10-CM | POA: Diagnosis not present

## 2022-07-10 DIAGNOSIS — H2513 Age-related nuclear cataract, bilateral: Secondary | ICD-10-CM | POA: Diagnosis not present

## 2022-07-17 ENCOUNTER — Encounter (INDEPENDENT_AMBULATORY_CARE_PROVIDER_SITE_OTHER): Payer: Medicare Other | Admitting: Ophthalmology

## 2022-07-17 DIAGNOSIS — H43813 Vitreous degeneration, bilateral: Secondary | ICD-10-CM | POA: Diagnosis not present

## 2022-07-17 DIAGNOSIS — H35371 Puckering of macula, right eye: Secondary | ICD-10-CM

## 2022-07-17 DIAGNOSIS — H2513 Age-related nuclear cataract, bilateral: Secondary | ICD-10-CM | POA: Diagnosis not present

## 2022-07-30 DIAGNOSIS — K080 Exfoliation of teeth due to systemic causes: Secondary | ICD-10-CM | POA: Diagnosis not present

## 2022-08-13 DIAGNOSIS — H269 Unspecified cataract: Secondary | ICD-10-CM | POA: Diagnosis not present

## 2022-08-13 DIAGNOSIS — H2512 Age-related nuclear cataract, left eye: Secondary | ICD-10-CM | POA: Diagnosis not present

## 2022-08-13 DIAGNOSIS — H2511 Age-related nuclear cataract, right eye: Secondary | ICD-10-CM | POA: Diagnosis not present

## 2022-08-20 DIAGNOSIS — H2512 Age-related nuclear cataract, left eye: Secondary | ICD-10-CM | POA: Diagnosis not present

## 2022-08-20 DIAGNOSIS — H269 Unspecified cataract: Secondary | ICD-10-CM | POA: Diagnosis not present

## 2023-02-06 DIAGNOSIS — K080 Exfoliation of teeth due to systemic causes: Secondary | ICD-10-CM | POA: Diagnosis not present

## 2023-04-24 DIAGNOSIS — Z961 Presence of intraocular lens: Secondary | ICD-10-CM | POA: Diagnosis not present

## 2023-04-24 DIAGNOSIS — H524 Presbyopia: Secondary | ICD-10-CM | POA: Diagnosis not present

## 2023-05-30 DIAGNOSIS — D225 Melanocytic nevi of trunk: Secondary | ICD-10-CM | POA: Diagnosis not present

## 2023-05-30 DIAGNOSIS — D2272 Melanocytic nevi of left lower limb, including hip: Secondary | ICD-10-CM | POA: Diagnosis not present

## 2023-05-30 DIAGNOSIS — L570 Actinic keratosis: Secondary | ICD-10-CM | POA: Diagnosis not present

## 2023-05-30 DIAGNOSIS — D2262 Melanocytic nevi of left upper limb, including shoulder: Secondary | ICD-10-CM | POA: Diagnosis not present

## 2023-05-30 DIAGNOSIS — D2261 Melanocytic nevi of right upper limb, including shoulder: Secondary | ICD-10-CM | POA: Diagnosis not present

## 2023-08-22 ENCOUNTER — Telehealth: Payer: Self-pay

## 2023-08-22 DIAGNOSIS — R079 Chest pain, unspecified: Secondary | ICD-10-CM | POA: Diagnosis not present

## 2023-08-22 DIAGNOSIS — I4891 Unspecified atrial fibrillation: Secondary | ICD-10-CM | POA: Diagnosis not present

## 2023-08-22 NOTE — Telephone Encounter (Signed)
 He is officially a new patient but happy we can get him in on Monday

## 2023-08-22 NOTE — Telephone Encounter (Signed)
 Spoke to patient and made him an acute visit with [facility] for Monday 6.2.25.      Copied from CRM [id number]. Topic: Appointments - Scheduling Inquiry for Clinic >> Aug 22, 2023 10:23 AM Isaac Powers wrote: Reason for CRM: Patient called in stating he went to urgent care with afib this morning and they told him to follow up with his PCP as soon as possible. Patient would like to see if Dr. Joelle Musca again but since the patient has not been seen since 2020 he would be a new patient. Patient is very frustrated that nothing for a new patient is available until June 25 and states he "will have a heart attack by then". Patient is asking if something else can be done since he is still a patient but hasn't been there in a little bit. Please advise patient.

## 2023-08-25 ENCOUNTER — Encounter: Payer: Self-pay | Admitting: Internal Medicine

## 2023-08-25 ENCOUNTER — Ambulatory Visit (INDEPENDENT_AMBULATORY_CARE_PROVIDER_SITE_OTHER): Admitting: Internal Medicine

## 2023-08-25 ENCOUNTER — Ambulatory Visit: Attending: Internal Medicine

## 2023-08-25 VITALS — BP 122/80 | HR 66 | Temp 98.0°F | Ht 70.0 in | Wt 215.0 lb

## 2023-08-25 DIAGNOSIS — I4891 Unspecified atrial fibrillation: Secondary | ICD-10-CM

## 2023-08-25 DIAGNOSIS — R079 Chest pain, unspecified: Secondary | ICD-10-CM

## 2023-08-25 DIAGNOSIS — Z8546 Personal history of malignant neoplasm of prostate: Secondary | ICD-10-CM | POA: Diagnosis not present

## 2023-08-25 NOTE — Assessment & Plan Note (Signed)
Will check PSA again 

## 2023-08-25 NOTE — Assessment & Plan Note (Addendum)
 Seen on EKG 3 days ago Unclear if it had anything to do with the chest/arm pain episode---could be long standing Heart regular on exam and EKG shows apparent sinus (regular with stable R-R interval. 1 apparent PAC. No ischemic changes EKG x 2 in urgent care read as atrial fib--but I can't see them Will continue the aspirin for now (CHADsVasc-2 ---only age) Will check zio monitor
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
## 2023-08-25 NOTE — Assessment & Plan Note (Signed)
 Atypical pain--woken from sleep at 5AM No exertional symptoms Discussed the possibility of ischemic disease--but hard to say Will go ahead with cardiology referral Call 911 if recurs

## 2023-08-26 ENCOUNTER — Ambulatory Visit: Payer: Self-pay | Admitting: Internal Medicine

## 2023-08-26 LAB — CBC
HCT: 43.9 % (ref 39.0–52.0)
Hemoglobin: 15 g/dL (ref 13.0–17.0)
MCHC: 34.1 g/dL (ref 30.0–36.0)
MCV: 98.1 fl (ref 78.0–100.0)
Platelets: 232 10*3/uL (ref 150.0–400.0)
RBC: 4.47 Mil/uL (ref 4.22–5.81)
RDW: 12.9 % (ref 11.5–15.5)
WBC: 5.8 10*3/uL (ref 4.0–10.5)

## 2023-08-26 LAB — COMPREHENSIVE METABOLIC PANEL WITH GFR
ALT: 52 U/L (ref 0–53)
AST: 38 U/L — ABNORMAL HIGH (ref 0–37)
Albumin: 4.6 g/dL (ref 3.5–5.2)
Alkaline Phosphatase: 65 U/L (ref 39–117)
BUN: 16 mg/dL (ref 6–23)
CO2: 29 meq/L (ref 19–32)
Calcium: 9.4 mg/dL (ref 8.4–10.5)
Chloride: 101 meq/L (ref 96–112)
Creatinine, Ser: 1.04 mg/dL (ref 0.40–1.50)
GFR: 67.41 mL/min (ref >60.00)
Glucose, Bld: 89 mg/dL (ref 70–99)
Potassium: 4.4 meq/L (ref 3.5–5.1)
Sodium: 138 meq/L (ref 135–145)
Total Bilirubin: 0.7 mg/dL (ref 0.2–1.2)
Total Protein: 7 g/dL (ref 6.0–8.3)

## 2023-08-26 LAB — LIPID PANEL
Cholesterol: 268 mg/dL — ABNORMAL HIGH (ref 0–200)
HDL: 45 mg/dL (ref >39.00)
LDL Cholesterol: 185 mg/dL — ABNORMAL HIGH (ref 0–99)
NonHDL: 222.91
Total CHOL/HDL Ratio: 6
Triglycerides: 190 mg/dL — ABNORMAL HIGH (ref 0.0–149.0)
VLDL: 38 mg/dL (ref 0.0–40.0)

## 2023-08-26 LAB — PSA: PSA: 0 ng/mL — ABNORMAL LOW (ref 0.10–4.00)

## 2023-08-26 LAB — TSH: TSH: 6.36 u[IU]/mL — ABNORMAL HIGH (ref 0.35–5.50)

## 2023-08-29 ENCOUNTER — Telehealth: Payer: Self-pay | Admitting: Internal Medicine

## 2023-08-29 NOTE — Telephone Encounter (Signed)
 Copied from CRM [id number]. Topic: Clinical - Medical Advice >> Aug 29, 2023 10:22 AM [place] wrote: Reason for CRM: got a heart monitor but does not know how to put it on, needs help - [phone]

## 2023-08-29 NOTE — Telephone Encounter (Signed)
 Spoke with pt. Advised him that he would need to contact to company that sent him the heart monitor to help him figure out to apply it. Pt verbalized understanding.

## 2023-09-29 DIAGNOSIS — K080 Exfoliation of teeth due to systemic causes: Secondary | ICD-10-CM | POA: Diagnosis not present

## 2023-10-08 DIAGNOSIS — I4891 Unspecified atrial fibrillation: Secondary | ICD-10-CM | POA: Diagnosis not present

## 2023-10-09 DIAGNOSIS — I4891 Unspecified atrial fibrillation: Secondary | ICD-10-CM | POA: Diagnosis not present
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
## 2023-10-27 ENCOUNTER — Ambulatory Visit: Attending: Cardiovascular Disease | Admitting: Cardiovascular Disease

## 2023-10-27 ENCOUNTER — Encounter: Payer: Self-pay | Admitting: Cardiovascular Disease

## 2023-10-27 VITALS — BP 150/80 | HR 78 | Resp 20 | Ht 70.0 in | Wt 215.2 lb

## 2023-10-27 DIAGNOSIS — R079 Chest pain, unspecified: Secondary | ICD-10-CM | POA: Diagnosis not present

## 2023-10-27 DIAGNOSIS — R9431 Abnormal electrocardiogram [ECG] [EKG]: Secondary | ICD-10-CM | POA: Diagnosis not present

## 2023-10-27 DIAGNOSIS — E785 Hyperlipidemia, unspecified: Secondary | ICD-10-CM

## 2023-10-27 DIAGNOSIS — I700 Atherosclerosis of aorta: Secondary | ICD-10-CM | POA: Diagnosis not present

## 2023-10-27 MED ORDER — ROSUVASTATIN CALCIUM 5 MG PO TABS: 5.0000 mg | ORAL_TABLET | Freq: Every day | ORAL | 3 refills | Status: AC

## 2023-10-27 NOTE — Patient Instructions (Addendum)
   Medication Instructions:   Please start crestor  5 mg daily  If you need a refill on your cardiac medications before your next appointment, please call your pharmacy.   Lab work: Your provider would like for you to return in 3 months to have the following labs drawn: Lipid and LFTs.   Please go to [facility] [address] ([facility] [address], [place] [address] You do not need an appointment.  They are open from 8 am- 4:30 pm.  Lunch from 1:00 pm- 2:00 pm You will need to be fasting.     Testing/Procedures: No new testing needed  Follow-Up: At [facility], you and your health needs are our priority.  As part of our continuing mission to provide you with exceptional heart care, we have created designated Provider Care Teams.  These Care Teams include your primary Cardiologist (physician) and Advanced Practice Providers (APPs -  Physician Assistants and Nurse Practitioners) who all work together to provide you with the care you need, when you need it.  You will need a follow up appointment in one year.   Providers on your designated Care Team:   Lonni Meager, NP Bernardino Bring, PA-C Cadence Furth, [place]  COVID-19 Vaccine Information can be found at: [email or web address] For questions related to vaccine distribution or appointments, please email [email or web address] or call [phone].

## 2023-11-05 ENCOUNTER — Encounter: Payer: Self-pay | Admitting: Internal Medicine

## 2023-12-17 ENCOUNTER — Other Ambulatory Visit

## 2023-12-24 ENCOUNTER — Encounter

## 2024-02-05 ENCOUNTER — Encounter: Payer: Self-pay | Admitting: Emergency Medicine
